# Patient Record
Sex: Male | Born: 1945 | Race: Black or African American | Hispanic: No | Marital: Married | Smoking: Former smoker
Health system: Southern US, Community
[De-identification: ages and names within clinical notes are randomized; demographics above are authoritative.]

## PROBLEM LIST (undated history)

## (undated) DIAGNOSIS — N289 Disorder of kidney and ureter, unspecified: Secondary | ICD-10-CM

## (undated) DIAGNOSIS — K219 Gastro-esophageal reflux disease without esophagitis: Secondary | ICD-10-CM

## (undated) DIAGNOSIS — I10 Essential (primary) hypertension: Secondary | ICD-10-CM

## (undated) HISTORY — PX: SUPRAPUBIC CATHETER PLACEMENT: SHX2473

---

## 1998-01-22 ENCOUNTER — Observation Stay (HOSPITAL_COMMUNITY): Admission: RE | Admit: 1998-01-22 | Discharge: 1998-01-23 | Payer: Self-pay | Admitting: Otolaryngology

## 2010-04-23 ENCOUNTER — Encounter (HOSPITAL_BASED_OUTPATIENT_CLINIC_OR_DEPARTMENT_OTHER)
Admission: RE | Admit: 2010-04-23 | Discharge: 2010-04-23 | Disposition: A | Discharge status: Home / Self Care | Payer: BC Managed Care – PPO | Source: Ambulatory Visit | Attending: Orthopedic Surgery | Admitting: Orthopedic Surgery

## 2010-04-23 DIAGNOSIS — Z01810 Encounter for preprocedural cardiovascular examination: Secondary | ICD-10-CM | POA: Insufficient documentation

## 2010-04-24 ENCOUNTER — Ambulatory Visit (HOSPITAL_BASED_OUTPATIENT_CLINIC_OR_DEPARTMENT_OTHER)
Admission: RE | Admit: 2010-04-24 | Discharge: 2010-04-24 | Disposition: A | Discharge status: Home / Self Care | Payer: BC Managed Care – PPO | Source: Ambulatory Visit | Attending: Orthopedic Surgery | Admitting: Orthopedic Surgery

## 2010-04-24 DIAGNOSIS — S93129A Dislocation of metatarsophalangeal joint of unspecified toe(s), initial encounter: Secondary | ICD-10-CM | POA: Insufficient documentation

## 2010-04-24 DIAGNOSIS — S92309A Fracture of unspecified metatarsal bone(s), unspecified foot, initial encounter for closed fracture: Secondary | ICD-10-CM | POA: Insufficient documentation

## 2010-04-24 DIAGNOSIS — W11XXXA Fall on and from ladder, initial encounter: Secondary | ICD-10-CM | POA: Insufficient documentation

## 2010-04-24 DIAGNOSIS — Y92009 Unspecified place in unspecified non-institutional (private) residence as the place of occurrence of the external cause: Secondary | ICD-10-CM | POA: Insufficient documentation

## 2010-04-24 DIAGNOSIS — Z01810 Encounter for preprocedural cardiovascular examination: Secondary | ICD-10-CM | POA: Insufficient documentation

## 2010-04-24 LAB — POCT HEMOGLOBIN-HEMACUE: Hemoglobin: 16.5 g/dL (ref 13.0–17.0)

## 2010-04-28 NOTE — Op Note (Signed)
Gerald Thompson, Gerald Thompson               ACCOUNT NO.:  [id number]  MEDICAL RECORD NO.:  [id number]           PATIENT TYPE:  LOCATION:                                 FACILITY:  PHYSICIAN:  Toni Arthurs, MD             DATE OF BIRTH:  DATE OF PROCEDURE:  04/24/2010 DATE OF DISCHARGE:                              OPERATIVE REPORT   PREOPERATIVE DIAGNOSES: 1. Right second and third metatarsal neck fractures. 2. Right fourth and fifth metatarsophalangeal joint dislocations.  POSTOPERATIVE DIAGNOSES: 1. Right second and third metatarsal neck fractures. 2. Right fourth and fifth metatarsophalangeal joint dislocations.  PROCEDURE: 1. Open reduction internal fixation of right second and third     metatarsal neck fractures. 2. Open reduction internal fixation of right fourth and fifth     metatarsophalangeal joint dislocations. 3. Right 2 through 5 metatarsophalangeal joint dorsal capsulotomies     with extensor digitorum brevis tenotomies of 2 through 4. 4. Right 2 through 5 extensor digitorum longus tenotomies (Z     lengthening). 5. Right fourth and fifth flexor to extensor transfers. 6. Intraoperative interpretation of fluoroscopic images greater than 1     hour.  SURGEON:  Toni Arthurs, MD  ANESTHESIA:  General, regional.  IV FLUIDS:  See anesthesia record.  ESTIMATED BLOOD LOSS:  Minimal.  TOURNIQUET TIME:  See anesthesia record.  COMPLICATIONS:  None apparent.  DISPOSITION:  Extubated awake, and stable to recovery.  INDICATIONS FOR PROCEDURE:  The patient is a 65 year old male who was in his usual state of health until he fell off a step ladder catching his right foot in the rungs as he fell.  This occurred more than a week ago. He presented to me with dorsal dislocations of his fourth and fifth toes at the MTP joints as well as displaced fractures of the second and third metatarsal necks.  He tells me that has had a longstanding problem with his fourth toe since an  injury playing sports in college.  He presents now for surgical treatment of these injuries.  He understands the risks and benefits of these procedures and elects to proceed.  PROCEDURE IN DETAIL:  After preoperative consent was obtained, the correct operative site was identified, the patient was brought to the operating room and placed supine on the operating table.  General anesthesia was induced.  Preoperative antibiotics were administered. Surgical time-out was taken.  Regional anesthetic had previously been administered.  The right lower extremity was prepped and draped in the standard sterile fashion.  The fourth and fifth MTP joint dislocations were addressed first.  The fifth MTP joint could be reduced passively that would not stay reduced, the fourth MTP could not be reduced in closed fashion.  Longitudinal incisions were marked over the second and fourth webspaces.  The extremity was exsanguinated and an Esmarch tourniquet was wrapped around the ankle.  The dorsal incision was made over the fourth webspace.  The blunt dissection was carried down through the subcutaneous tissue.  The extensor digitorum longus tendon was identified to the fifth toe.  It was noted to be  quite contracted.  It was lengthened in Z fashion.  A dorsal capsulotomy was then carried over the dorsal aspect of the fifth MTP joint.  The toe could then be reduced.  It was still quite prone to dislocation back into the dorsal position.  The decision was made to transfer the flexor tendon to hold this toe in its reduced position.  Attention was then turned to the fourth toe.  Blunt dissection was carried down to the level of the MTP joint.  The extensor digitorum brevis tendon was tenotomized and the segment resected.  This was done due to significant contracture of this tendon.  The extensor digitorum longus was delengthened again due to contracture.  A dorsal capsulotomy was performed.  The MTP joint was  then entered with a Therapist, nutritional and this was used to lever the toe down into a reduced position.  It would not stay reduced passively.  Again it was determined that a flexor to extensor transfer was necessary to preserve the reduction after removal of the pins postoperatively.  At this point, 0.054 K-wires were inserted through the tip of the toe and across the IP joints and into the MTP joint.  The toe was held in the reduced position and the wire was advanced into the metatarsal head and up into the metatarsal shaft. This was repeated for the fifth toe.  Both of the toes were now held in a reduced and straight position.  Prior to insertion of the K-wires, Brunner style incisions were made on the plantar aspect of the foot at the fourth and fifth toes.  Blunt dissection was carried down to the level of the flexor tendon.  The flexor tendon sheath was incised and the FDL tendon was identified.  It was isolated and released percutaneously from its insertion at the distal phalanx.  It was withdrawn through the plantar incision and split longitudinally.  It was tagged with 2-0 Vicryl sutures on either end of the tendon slips.  This was repeated for the fifth toe as well.  A small stab incision was made over the proximal phalanx dorsally.  A hemostat was then passed along the shaft to the proximal phalanx into the plantar incision retrieving the medial slip of tendon.  This was repeated for the lateral slip of tendon.  These were tied over the dorsum of the toe after placement of the K-wire.  This was repeated for the fifth toe.  At this point, AP and lateral x-ray showed appropriate reduction of the fourth and fifth toes at the MTP joint and appropriate position and length of both K-wires. The wounds were irrigated copiously.  Vertical mattress sutures and horizontal mattress sutures of 3-0 Prolene were used to close all of the skin incisions.  Attention was then turned to the second and  third toes.  The toe was entered from the tip with a 0.062 K-wire.  It was inserted retrograde across the DIP and PIP joints in the proximal phalanx.  An attempt was made at closed reduction with percutaneous pin, but this was unsuccessful.  A longitudinal incision marked over the second webspace was then made.  Blunt dissection was carried down through the subcutaneous tissue.  Once again, the extensor tendons were noted to be quite contracted.  Extensor digitorum brevis tenotomies were performed at both the toes.  The extensor digitorum longus tendons were both lengthened in Z fashion.  Dorsal capsulotomies were performed at both MTP joints.  This allowed passive correction of position of  the toes, which had previously been dorsally contracted.  The second metatarsal fracture was identified and a Glorious Peach was used to mobilize the fracture fragments.  A tenaculum was used to grasp the distal fragment and position it while a K-wire was advanced from the tip of the toe across the IP joints and the MP joint into the head.  It was then advanced across the fracture site and into the metatarsal shaft.  The third toe could not be reduced closed, so this procedure was again repeated. Final AP and lateral x-rays showed reduced fractures of the second and third metatarsals as well as dislocations of the fourth and fifth MTP joints that had been appropriately reduced.  The K-wires were all in appropriate position on AP and lateral views.  The dorsal wound was again irrigated.  Vertical mattress sutures of 3-0 Prolene were used to close the skin incisions.  All of the wires were trimmed and capped. Sterile dressings were applied followed by a well-padded short-leg splint.  Tourniquet was released after application of the dressings. All four lesser toes were noted to be well perfused postoperatively.  The patient was then awakened from anesthesia and transported to the recovery room in stable  condition.  FOLLOWUP PLAN:  The patient will be discharged to home today.  He will be nonweightbearing on the right lower extremity.  A followup with me in 2 weeks for suture removal and conversion to a cast.     Toni Arthurs, MD     JH/MEDQ  D:  04/24/2010  T:  04/24/2010  Job:  [id number]  Electronically Signed by Jonny Ruiz Koki Buxton  on 04/28/2010 09:09:00 AM

## 2014-05-15 DIAGNOSIS — A499 Bacterial infection, unspecified: Secondary | ICD-10-CM | POA: Diagnosis not present

## 2014-05-15 DIAGNOSIS — R3915 Urgency of urination: Secondary | ICD-10-CM | POA: Diagnosis not present

## 2014-05-15 DIAGNOSIS — F17210 Nicotine dependence, cigarettes, uncomplicated: Secondary | ICD-10-CM | POA: Diagnosis not present

## 2014-05-15 DIAGNOSIS — N319 Neuromuscular dysfunction of bladder, unspecified: Secondary | ICD-10-CM | POA: Diagnosis not present

## 2014-05-15 DIAGNOSIS — R8299 Other abnormal findings in urine: Secondary | ICD-10-CM | POA: Diagnosis not present

## 2014-05-15 DIAGNOSIS — N329 Bladder disorder, unspecified: Secondary | ICD-10-CM | POA: Diagnosis not present

## 2014-05-15 DIAGNOSIS — N1339 Other hydronephrosis: Secondary | ICD-10-CM | POA: Diagnosis not present

## 2014-06-10 DIAGNOSIS — A499 Bacterial infection, unspecified: Secondary | ICD-10-CM | POA: Diagnosis not present

## 2014-06-10 DIAGNOSIS — N390 Urinary tract infection, site not specified: Secondary | ICD-10-CM | POA: Diagnosis not present

## 2014-06-17 DIAGNOSIS — N401 Enlarged prostate with lower urinary tract symptoms: Secondary | ICD-10-CM | POA: Diagnosis not present

## 2014-06-17 DIAGNOSIS — R339 Retention of urine, unspecified: Secondary | ICD-10-CM | POA: Diagnosis not present

## 2014-08-26 DIAGNOSIS — N1330 Unspecified hydronephrosis: Secondary | ICD-10-CM | POA: Diagnosis not present

## 2014-08-26 DIAGNOSIS — Q6100 Congenital renal cyst, unspecified: Secondary | ICD-10-CM | POA: Diagnosis not present

## 2014-08-26 DIAGNOSIS — Z720 Tobacco use: Secondary | ICD-10-CM | POA: Diagnosis not present

## 2014-08-26 DIAGNOSIS — N319 Neuromuscular dysfunction of bladder, unspecified: Secondary | ICD-10-CM | POA: Diagnosis not present

## 2014-08-26 DIAGNOSIS — N1339 Other hydronephrosis: Secondary | ICD-10-CM | POA: Diagnosis not present

## 2014-09-05 DIAGNOSIS — F329 Major depressive disorder, single episode, unspecified: Secondary | ICD-10-CM | POA: Diagnosis not present

## 2014-09-05 DIAGNOSIS — R51 Headache: Secondary | ICD-10-CM | POA: Diagnosis not present

## 2014-09-05 DIAGNOSIS — K921 Melena: Secondary | ICD-10-CM | POA: Diagnosis not present

## 2014-09-05 DIAGNOSIS — Z86010 Personal history of colonic polyps: Secondary | ICD-10-CM | POA: Diagnosis not present

## 2014-09-05 DIAGNOSIS — R109 Unspecified abdominal pain: Secondary | ICD-10-CM | POA: Diagnosis not present

## 2014-09-05 DIAGNOSIS — R3915 Urgency of urination: Secondary | ICD-10-CM | POA: Diagnosis not present

## 2014-09-05 DIAGNOSIS — R319 Hematuria, unspecified: Secondary | ICD-10-CM | POA: Diagnosis not present

## 2014-09-05 DIAGNOSIS — F17210 Nicotine dependence, cigarettes, uncomplicated: Secondary | ICD-10-CM | POA: Diagnosis not present

## 2014-09-05 DIAGNOSIS — N401 Enlarged prostate with lower urinary tract symptoms: Secondary | ICD-10-CM | POA: Diagnosis not present

## 2014-09-05 DIAGNOSIS — R5383 Other fatigue: Secondary | ICD-10-CM | POA: Diagnosis not present

## 2014-09-05 DIAGNOSIS — R110 Nausea: Secondary | ICD-10-CM | POA: Diagnosis not present

## 2014-09-05 DIAGNOSIS — H9190 Unspecified hearing loss, unspecified ear: Secondary | ICD-10-CM | POA: Diagnosis not present

## 2014-09-05 DIAGNOSIS — K219 Gastro-esophageal reflux disease without esophagitis: Secondary | ICD-10-CM | POA: Diagnosis not present

## 2014-09-05 DIAGNOSIS — R634 Abnormal weight loss: Secondary | ICD-10-CM | POA: Diagnosis not present

## 2014-09-05 DIAGNOSIS — R5381 Other malaise: Secondary | ICD-10-CM | POA: Diagnosis not present

## 2014-09-05 DIAGNOSIS — H538 Other visual disturbances: Secondary | ICD-10-CM | POA: Diagnosis not present

## 2014-09-05 DIAGNOSIS — R6883 Chills (without fever): Secondary | ICD-10-CM | POA: Diagnosis not present

## 2014-09-05 DIAGNOSIS — Z801 Family history of malignant neoplasm of trachea, bronchus and lung: Secondary | ICD-10-CM | POA: Diagnosis not present

## 2014-09-05 DIAGNOSIS — R300 Dysuria: Secondary | ICD-10-CM | POA: Diagnosis not present

## 2014-09-05 DIAGNOSIS — R6881 Early satiety: Secondary | ICD-10-CM | POA: Diagnosis not present

## 2014-09-05 DIAGNOSIS — Z800 Family history of malignant neoplasm of digestive organs: Secondary | ICD-10-CM | POA: Diagnosis not present

## 2014-09-05 DIAGNOSIS — K862 Cyst of pancreas: Secondary | ICD-10-CM | POA: Diagnosis not present

## 2014-09-05 DIAGNOSIS — K5900 Constipation, unspecified: Secondary | ICD-10-CM | POA: Diagnosis not present

## 2014-09-05 DIAGNOSIS — R05 Cough: Secondary | ICD-10-CM | POA: Diagnosis not present

## 2014-09-05 DIAGNOSIS — Z79899 Other long term (current) drug therapy: Secondary | ICD-10-CM | POA: Diagnosis not present

## 2014-09-16 DIAGNOSIS — R339 Retention of urine, unspecified: Secondary | ICD-10-CM | POA: Diagnosis not present

## 2014-09-16 DIAGNOSIS — N401 Enlarged prostate with lower urinary tract symptoms: Secondary | ICD-10-CM | POA: Diagnosis not present

## 2014-09-17 DIAGNOSIS — D123 Benign neoplasm of transverse colon: Secondary | ICD-10-CM | POA: Diagnosis not present

## 2014-09-17 DIAGNOSIS — D125 Benign neoplasm of sigmoid colon: Secondary | ICD-10-CM | POA: Diagnosis not present

## 2014-09-17 DIAGNOSIS — K219 Gastro-esophageal reflux disease without esophagitis: Secondary | ICD-10-CM | POA: Diagnosis not present

## 2014-09-17 DIAGNOSIS — I10 Essential (primary) hypertension: Secondary | ICD-10-CM | POA: Diagnosis not present

## 2014-09-17 DIAGNOSIS — Z800 Family history of malignant neoplasm of digestive organs: Secondary | ICD-10-CM | POA: Diagnosis not present

## 2014-09-17 DIAGNOSIS — R6881 Early satiety: Secondary | ICD-10-CM | POA: Diagnosis not present

## 2014-09-17 DIAGNOSIS — Z1211 Encounter for screening for malignant neoplasm of colon: Secondary | ICD-10-CM | POA: Diagnosis not present

## 2014-09-17 DIAGNOSIS — R630 Anorexia: Secondary | ICD-10-CM | POA: Diagnosis not present

## 2014-09-17 DIAGNOSIS — Z86010 Personal history of colonic polyps: Secondary | ICD-10-CM | POA: Diagnosis not present

## 2014-09-17 DIAGNOSIS — R634 Abnormal weight loss: Secondary | ICD-10-CM | POA: Diagnosis not present

## 2014-09-23 DIAGNOSIS — R339 Retention of urine, unspecified: Secondary | ICD-10-CM | POA: Diagnosis not present

## 2014-09-23 DIAGNOSIS — N319 Neuromuscular dysfunction of bladder, unspecified: Secondary | ICD-10-CM | POA: Diagnosis not present

## 2014-09-23 DIAGNOSIS — N399 Disorder of urinary system, unspecified: Secondary | ICD-10-CM | POA: Diagnosis not present

## 2014-09-23 DIAGNOSIS — R319 Hematuria, unspecified: Secondary | ICD-10-CM | POA: Diagnosis not present

## 2014-09-23 DIAGNOSIS — N390 Urinary tract infection, site not specified: Secondary | ICD-10-CM | POA: Diagnosis not present

## 2014-10-03 DIAGNOSIS — F329 Major depressive disorder, single episode, unspecified: Secondary | ICD-10-CM | POA: Diagnosis not present

## 2014-10-03 DIAGNOSIS — R634 Abnormal weight loss: Secondary | ICD-10-CM | POA: Diagnosis not present

## 2014-10-03 DIAGNOSIS — D649 Anemia, unspecified: Secondary | ICD-10-CM | POA: Diagnosis not present

## 2014-10-03 DIAGNOSIS — Z720 Tobacco use: Secondary | ICD-10-CM | POA: Diagnosis not present

## 2014-10-03 DIAGNOSIS — K4020 Bilateral inguinal hernia, without obstruction or gangrene, not specified as recurrent: Secondary | ICD-10-CM | POA: Diagnosis not present

## 2014-10-03 DIAGNOSIS — Z86010 Personal history of colonic polyps: Secondary | ICD-10-CM | POA: Diagnosis not present

## 2014-10-03 DIAGNOSIS — N319 Neuromuscular dysfunction of bladder, unspecified: Secondary | ICD-10-CM | POA: Diagnosis not present

## 2014-10-03 DIAGNOSIS — I129 Hypertensive chronic kidney disease with stage 1 through stage 4 chronic kidney disease, or unspecified chronic kidney disease: Secondary | ICD-10-CM | POA: Diagnosis not present

## 2014-10-03 DIAGNOSIS — Z862 Personal history of diseases of the blood and blood-forming organs and certain disorders involving the immune mechanism: Secondary | ICD-10-CM | POA: Diagnosis not present

## 2014-10-03 DIAGNOSIS — N184 Chronic kidney disease, stage 4 (severe): Secondary | ICD-10-CM | POA: Diagnosis not present

## 2014-11-07 DIAGNOSIS — D649 Anemia, unspecified: Secondary | ICD-10-CM | POA: Diagnosis not present

## 2014-11-07 DIAGNOSIS — I129 Hypertensive chronic kidney disease with stage 1 through stage 4 chronic kidney disease, or unspecified chronic kidney disease: Secondary | ICD-10-CM | POA: Diagnosis not present

## 2014-11-07 DIAGNOSIS — Z79899 Other long term (current) drug therapy: Secondary | ICD-10-CM | POA: Diagnosis not present

## 2014-11-07 DIAGNOSIS — N184 Chronic kidney disease, stage 4 (severe): Secondary | ICD-10-CM | POA: Diagnosis not present

## 2014-11-07 DIAGNOSIS — F17210 Nicotine dependence, cigarettes, uncomplicated: Secondary | ICD-10-CM | POA: Diagnosis not present

## 2014-11-07 DIAGNOSIS — N319 Neuromuscular dysfunction of bladder, unspecified: Secondary | ICD-10-CM | POA: Diagnosis not present

## 2014-11-07 DIAGNOSIS — F329 Major depressive disorder, single episode, unspecified: Secondary | ICD-10-CM | POA: Diagnosis not present

## 2014-11-07 DIAGNOSIS — N189 Chronic kidney disease, unspecified: Secondary | ICD-10-CM | POA: Diagnosis not present

## 2014-11-07 DIAGNOSIS — R339 Retention of urine, unspecified: Secondary | ICD-10-CM | POA: Diagnosis not present

## 2014-11-21 DIAGNOSIS — N401 Enlarged prostate with lower urinary tract symptoms: Secondary | ICD-10-CM | POA: Diagnosis not present

## 2014-11-21 DIAGNOSIS — R339 Retention of urine, unspecified: Secondary | ICD-10-CM | POA: Diagnosis not present

## 2014-12-25 DIAGNOSIS — N319 Neuromuscular dysfunction of bladder, unspecified: Secondary | ICD-10-CM | POA: Diagnosis not present

## 2015-01-30 DIAGNOSIS — N401 Enlarged prostate with lower urinary tract symptoms: Secondary | ICD-10-CM | POA: Diagnosis not present

## 2015-01-30 DIAGNOSIS — R339 Retention of urine, unspecified: Secondary | ICD-10-CM | POA: Diagnosis not present

## 2015-03-02 DIAGNOSIS — N289 Disorder of kidney and ureter, unspecified: Secondary | ICD-10-CM

## 2015-03-02 HISTORY — DX: Disorder of kidney and ureter, unspecified: N28.9

## 2015-03-31 DIAGNOSIS — R339 Retention of urine, unspecified: Secondary | ICD-10-CM | POA: Diagnosis not present

## 2015-03-31 DIAGNOSIS — N183 Chronic kidney disease, stage 3 (moderate): Secondary | ICD-10-CM | POA: Diagnosis not present

## 2015-04-07 DIAGNOSIS — N184 Chronic kidney disease, stage 4 (severe): Secondary | ICD-10-CM | POA: Diagnosis not present

## 2015-04-07 DIAGNOSIS — I129 Hypertensive chronic kidney disease with stage 1 through stage 4 chronic kidney disease, or unspecified chronic kidney disease: Secondary | ICD-10-CM | POA: Diagnosis not present

## 2015-04-07 DIAGNOSIS — Z87891 Personal history of nicotine dependence: Secondary | ICD-10-CM | POA: Diagnosis not present

## 2015-04-07 DIAGNOSIS — N390 Urinary tract infection, site not specified: Secondary | ICD-10-CM | POA: Diagnosis not present

## 2015-04-07 DIAGNOSIS — R319 Hematuria, unspecified: Secondary | ICD-10-CM | POA: Diagnosis not present

## 2015-04-10 DIAGNOSIS — R748 Abnormal levels of other serum enzymes: Secondary | ICD-10-CM | POA: Diagnosis not present

## 2015-04-10 DIAGNOSIS — N390 Urinary tract infection, site not specified: Secondary | ICD-10-CM | POA: Diagnosis not present

## 2015-04-10 DIAGNOSIS — R319 Hematuria, unspecified: Secondary | ICD-10-CM | POA: Diagnosis not present

## 2015-04-10 DIAGNOSIS — Z87891 Personal history of nicotine dependence: Secondary | ICD-10-CM | POA: Diagnosis not present

## 2015-04-10 DIAGNOSIS — N184 Chronic kidney disease, stage 4 (severe): Secondary | ICD-10-CM | POA: Diagnosis not present

## 2015-04-10 DIAGNOSIS — R7989 Other specified abnormal findings of blood chemistry: Secondary | ICD-10-CM | POA: Diagnosis not present

## 2015-04-10 DIAGNOSIS — I129 Hypertensive chronic kidney disease with stage 1 through stage 4 chronic kidney disease, or unspecified chronic kidney disease: Secondary | ICD-10-CM | POA: Diagnosis not present

## 2015-04-18 DIAGNOSIS — N390 Urinary tract infection, site not specified: Secondary | ICD-10-CM | POA: Diagnosis not present

## 2015-04-18 DIAGNOSIS — R319 Hematuria, unspecified: Secondary | ICD-10-CM | POA: Diagnosis not present

## 2015-04-21 DIAGNOSIS — N1330 Unspecified hydronephrosis: Secondary | ICD-10-CM | POA: Diagnosis not present

## 2015-04-21 DIAGNOSIS — R7989 Other specified abnormal findings of blood chemistry: Secondary | ICD-10-CM | POA: Diagnosis not present

## 2015-05-08 DIAGNOSIS — N319 Neuromuscular dysfunction of bladder, unspecified: Secondary | ICD-10-CM | POA: Diagnosis not present

## 2015-05-19 DIAGNOSIS — Z79899 Other long term (current) drug therapy: Secondary | ICD-10-CM | POA: Diagnosis not present

## 2015-05-19 DIAGNOSIS — Z960 Presence of urogenital implants: Secondary | ICD-10-CM | POA: Diagnosis not present

## 2015-05-19 DIAGNOSIS — N184 Chronic kidney disease, stage 4 (severe): Secondary | ICD-10-CM | POA: Diagnosis not present

## 2015-05-19 DIAGNOSIS — Z87891 Personal history of nicotine dependence: Secondary | ICD-10-CM | POA: Diagnosis not present

## 2015-05-19 DIAGNOSIS — I129 Hypertensive chronic kidney disease with stage 1 through stage 4 chronic kidney disease, or unspecified chronic kidney disease: Secondary | ICD-10-CM | POA: Diagnosis not present

## 2015-06-03 DIAGNOSIS — N401 Enlarged prostate with lower urinary tract symptoms: Secondary | ICD-10-CM | POA: Diagnosis not present

## 2015-06-03 DIAGNOSIS — R339 Retention of urine, unspecified: Secondary | ICD-10-CM | POA: Diagnosis not present

## 2015-06-10 DIAGNOSIS — N184 Chronic kidney disease, stage 4 (severe): Secondary | ICD-10-CM | POA: Diagnosis not present

## 2015-06-10 DIAGNOSIS — R339 Retention of urine, unspecified: Secondary | ICD-10-CM | POA: Diagnosis not present

## 2015-06-10 DIAGNOSIS — K4021 Bilateral inguinal hernia, without obstruction or gangrene, recurrent: Secondary | ICD-10-CM | POA: Diagnosis not present

## 2015-06-10 DIAGNOSIS — N319 Neuromuscular dysfunction of bladder, unspecified: Secondary | ICD-10-CM | POA: Diagnosis not present

## 2015-07-01 DIAGNOSIS — R339 Retention of urine, unspecified: Secondary | ICD-10-CM | POA: Diagnosis not present

## 2015-07-01 DIAGNOSIS — R8290 Unspecified abnormal findings in urine: Secondary | ICD-10-CM | POA: Diagnosis not present

## 2015-07-03 DIAGNOSIS — H6121 Impacted cerumen, right ear: Secondary | ICD-10-CM | POA: Diagnosis not present

## 2015-07-22 DIAGNOSIS — R339 Retention of urine, unspecified: Secondary | ICD-10-CM | POA: Diagnosis not present

## 2015-08-04 ENCOUNTER — Emergency Department (HOSPITAL_COMMUNITY)
Admission: EM | Admit: 2015-08-04 | Discharge: 2015-08-04 | Disposition: A | Discharge status: Home / Self Care | Payer: BLUE CROSS/BLUE SHIELD | Attending: Emergency Medicine | Admitting: Emergency Medicine

## 2015-08-04 ENCOUNTER — Encounter (HOSPITAL_COMMUNITY): Payer: Self-pay

## 2015-08-04 DIAGNOSIS — T83091A Other mechanical complication of indwelling urethral catheter, initial encounter: Secondary | ICD-10-CM | POA: Diagnosis not present

## 2015-08-04 DIAGNOSIS — Y733 Surgical instruments, materials and gastroenterology and urology devices (including sutures) associated with adverse incidents: Secondary | ICD-10-CM | POA: Insufficient documentation

## 2015-08-04 DIAGNOSIS — T83511A Infection and inflammatory reaction due to indwelling urethral catheter, initial encounter: Secondary | ICD-10-CM | POA: Diagnosis not present

## 2015-08-04 DIAGNOSIS — N390 Urinary tract infection, site not specified: Secondary | ICD-10-CM | POA: Diagnosis not present

## 2015-08-04 DIAGNOSIS — T83098A Other mechanical complication of other indwelling urethral catheter, initial encounter: Secondary | ICD-10-CM | POA: Diagnosis not present

## 2015-08-04 DIAGNOSIS — Z792 Long term (current) use of antibiotics: Secondary | ICD-10-CM | POA: Insufficient documentation

## 2015-08-04 DIAGNOSIS — T839XXA Unspecified complication of genitourinary prosthetic device, implant and graft, initial encounter: Secondary | ICD-10-CM

## 2015-08-04 DIAGNOSIS — Z79899 Other long term (current) drug therapy: Secondary | ICD-10-CM | POA: Insufficient documentation

## 2015-08-04 DIAGNOSIS — B9629 Other Escherichia coli [E. coli] as the cause of diseases classified elsewhere: Secondary | ICD-10-CM | POA: Diagnosis not present

## 2015-08-04 DIAGNOSIS — R339 Retention of urine, unspecified: Secondary | ICD-10-CM | POA: Diagnosis present

## 2015-08-04 LAB — URINALYSIS, ROUTINE W REFLEX MICROSCOPIC
Bilirubin Urine: NEGATIVE
Glucose, UA: NEGATIVE mg/dL
Ketones, ur: NEGATIVE mg/dL
Nitrite: POSITIVE — AB
PROTEIN: 100 mg/dL — AB
SPECIFIC GRAVITY, URINE: 1.011 (ref 1.005–1.030)
pH: 6.5 (ref 5.0–8.0)

## 2015-08-04 LAB — URINE MICROSCOPIC-ADD ON

## 2015-08-04 MED ORDER — CEFTRIAXONE SODIUM 1 G IJ SOLR
1.0000 g | Freq: Once | INTRAMUSCULAR | Status: AC
  Administered 2015-08-04: 1 g via INTRAMUSCULAR
  Filled 2015-08-04: qty 10

## 2015-08-04 MED ORDER — STERILE WATER FOR INJECTION IJ SOLN
INTRAMUSCULAR | Status: AC
  Administered 2015-08-04: 10 mL
  Filled 2015-08-04: qty 10

## 2015-08-04 MED ORDER — CEPHALEXIN 500 MG PO CAPS: 500.0000 mg | ORAL_CAPSULE | Freq: Four times a day (QID) | ORAL | Status: DC

## 2015-08-04 NOTE — Discharge Instructions (Signed)
If you develop fever or any new or worsening symptoms you should be reevaluated immediately.   Foley Catheter Care, Adult A Foley catheter is a soft, flexible tube that is placed into the bladder to drain urine. A Foley catheter may be inserted if:  You leak urine or are not able to control when you urinate (urinary incontinence).  You are not able to urinate when you need to (urinary retention).  You had prostate surgery or surgery on the genitals.  You have certain medical conditions, such as multiple sclerosis, dementia, or a spinal cord injury. If you are going home with a Foley catheter in place, follow the instructions below. TAKING CARE OF THE CATHETER 1. Wash your hands with soap and water. 2. Using mild soap and warm water on a clean washcloth:  Clean the area on your body closest to the catheter insertion site using a circular motion, moving away from the catheter. Never wipe toward the catheter because this could sweep bacteria up into the urethra and cause infection.  Remove all traces of soap. Pat the area dry with a clean towel. For males, reposition the foreskin. 3. Attach the catheter to your leg so there is no tension on the catheter. Use adhesive tape or a leg strap. If you are using adhesive tape, remove any sticky residue left behind by the previous tape you used. 4. Keep the drainage bag below the level of the bladder, but keep it off the floor. 5. Check throughout the day to be sure the catheter is working and urine is draining freely. Make sure the tubing does not become kinked. 6. Do not pull on the catheter or try to remove it. Pulling could damage internal tissues. TAKING CARE OF THE DRAINAGE BAGS You will be given two drainage bags to take home. One is a large overnight drainage bag, and the other is a smaller leg bag that fits underneath clothing. You may wear the overnight bag at any time, but you should never wear the smaller leg bag at night. Follow the  instructions below for how to empty, change, and clean your drainage bags. Emptying the Drainage Bag You must empty your drainage bag when it is  - full or at least 2-3 times a day. 1. Wash your hands with soap and water. 2. Keep the drainage bag below your hips, below the level of your bladder. This stops urine from going back into the tubing and into your bladder. 3. Hold the dirty bag over the toilet or a clean container. 4. Open the pour spout at the bottom of the bag and empty the urine into the toilet or container. Do not let the pour spout touch the toilet, container, or any other surface. Doing so can place bacteria on the bag, which can cause an infection. 5. Clean the pour spout with a gauze pad or cotton ball that has rubbing alcohol on it. 6. Close the pour spout. 7. Attach the bag to your leg with adhesive tape or a leg strap. 8. Wash your hands well. Changing the Drainage Bag Change your drainage bag once a month or sooner if it starts to smell bad or look dirty. Below are steps to follow when changing the drainage bag. 1. Wash your hands with soap and water. 2. Pinch off the rubber catheter so that urine does not spill out. 3. Disconnect the catheter tube from the drainage tube at the connection valve. Do not let the tubes touch any surface. 4. Clean  the end of the catheter tube with an alcohol wipe. Use a different alcohol wipe to clean the end of the drainage tube. 5. Connect the catheter tube to the drainage tube of the clean drainage bag. 6. Attach the new bag to the leg with adhesive tape or a leg strap. Avoid attaching the new bag too tightly. 7. Wash your hands well. Cleaning the Drainage Bag 1. Wash your hands with soap and water. 2. Wash the bag in warm, soapy water. 3. Rinse the bag thoroughly with warm water. 4. Fill the bag with a solution of white vinegar and water (1 cup vinegar to 1 qt warm water [.2 L vinegar to 1 L warm water]). Close the bag and soak it for  30 minutes in the solution. 5. Rinse the bag with warm water. 6. Hang the bag to dry with the pour spout open and hanging downward. 7. Store the clean bag (once it is dry) in a clean plastic bag. 8. Wash your hands well. PREVENTING INFECTION  Wash your hands before and after handling your catheter.  Take showers daily and wash the area where the catheter enters your body. Do not take baths. Replace wet leg straps with dry ones, if this applies.  Do not use powders, sprays, or lotions on the genital area. Only use creams, lotions, or ointments as directed by your caregiver.  For females, wipe from front to back after each bowel movement.  Drink enough fluids to keep your urine clear or pale yellow unless you have a fluid restriction.  Do not let the drainage bag or tubing touch or lie on the floor.  Wear cotton underwear to absorb moisture and to keep your skin drier. SEEK MEDICAL CARE IF:   Your urine is cloudy or smells unusually bad.  Your catheter becomes clogged.  You are not draining urine into the bag or your bladder feels full.  Your catheter starts to leak. SEEK IMMEDIATE MEDICAL CARE IF:   You have pain, swelling, redness, or pus where the catheter enters the body.  You have pain in the abdomen, legs, lower back, or bladder.  You have a fever.  You see blood fill the catheter, or your urine is pink or red.  You have nausea, vomiting, or chills.  Your catheter gets pulled out. MAKE SURE YOU:   Understand these instructions.  Will watch your condition.  Will get help right away if you are not doing well or get worse.   This information is not intended to replace advice given to you by your health care provider. Make sure you discuss any questions you have with your health care provider.   Document Released: 02/15/2005 Document Revised: 07/02/2013 Document Reviewed: 02/07/2012 Elsevier Interactive Patient Education 2016 [place] Urinary Tract Infection FAQs What is "catheter-associated urinary tract infection"? A urinary tract infection (also called "UTI") is an infection in the urinary system, which includes the bladder (which stores the urine) and the kidneys (which filter the blood to make urine). Germs (for example, bacteria or yeasts) do not normally live in these areas; but if germs are introduced, an infection can occur. If you have a urinary catheter, germs can travel along the catheter and cause an infection in your bladder or your kidney; in that case it is called a catheter-associated urinary tract infection (or "CA-UTI").  What is a urinary catheter? A urinary catheter is a thin tube placed in the bladder to drain urine. Urine drains through  the tube into a bag that collects the urine. A urinary catheter may be used:  If you are not able to urinate on your own  To measure the amount of urine that you make, for example, during intensive care  During and after some types of surgery  During some tests of the kidneys and bladder People with urinary catheters have a much higher chance of getting a urinary tract infection than people who don't have a catheter. How do I get a catheter-associated urinary tract infection (CA-UTI)? If germs enter the urinary tract, they may cause an infection. Many of the germs that cause a catheter-associated urinary tract infection are common germs found in your intestines that do not usually cause an infection there. Germs can enter the urinary tract when the catheter is being put in or while the catheter remains in the bladder.  What are the symptoms of a urinary tract infection? Some of the common symptoms of a urinary tract infection are: 7. Burning or pain in the lower abdomen (that is, below the stomach) 8. Fever 9. Bloody urine may be a sign of infection, but is also caused by other problems 10. Burning during urination or an increase in the  frequency of urination after the catheter is removed. Sometimes people with catheter-associated urinary tract infections do not have these symptoms of infection. Can catheter-associated urinary tract infections be treated? Yes, most catheter-associated urinary tract infections can be treated with antibiotics and removal or change of the catheter. Your doctor will determine which antibiotic is best for you.  What are some of the things that hospitals are doing to prevent catheter-associated urinary tract infections? To prevent urinary tract infections, doctors and nurses take the following actions.  Catheter insertion 9. External catheters in men (these look like condoms and are placed over the penis rather than into the penis) 10. Putting a temporary catheter in to drain the urine and removing it right away. This is called intermittent urethral catheterization. Catheter care What can I do to help prevent catheter-associated urinary tract infections if I have a catheter? 9. Always clean your hands before and after doing catheter care. 10. Always keep your urine bag below the level of your bladder. 11. Do not tug or pull on the tubing. 12. Do not twist or kink the catheter tubing. 70. Ask your healthcare provider each day if you still need the catheter. What do I need to do when I go home from the hospital?  If you will be going home with a catheter, your doctor or nurse should explain everything you need to know about taking care of the catheter. Make sure you understand how to care for it before you leave the hospital.  If you develop any of the symptoms of a urinary tract infection, such as burning or pain in the lower abdomen, fever, or an increase in the frequency of urination, contact your doctor or nurse immediately.  Before you go home, make sure you know who to contact if you have questions or problems after you get home. If you have questions, please ask your doctor or  nurse. Developed and co-sponsored by [employer] for [place] [phone]); Infectious Diseases Society of [place] (IDSA); [place]; Association for Professionals in Infection Control and Epidemiology (APIC); Centers for Disease Control and Prevention (CDC); and [employer].   This information is not intended to replace advice given to you by your health care provider. Make sure you discuss any  questions you have with your health care provider.   Document Released: 11/10/2011 Document Revised: 07/02/2014 Document Reviewed: 05/01/2014 Elsevier Interactive Patient Education [employer].

## 2015-08-04 NOTE — ED Provider Notes (Signed)
CSN: [id number]     Arrival date & time 08/04/15  [id number] History   First MD Initiated Contact with Patient 08/04/15 0425     Chief Complaint  Patient presents with  . Urinary Retention     (Consider location/radiation/quality/duration/timing/severity/associated sxs/prior Treatment) HPI  This is a 70 year old male with a chronic indwelling Foley catheter who presents with urinary retention and lower abdominal pain. Patient reports lower abdominal discomfort and decreased drainage into his urinary bag as of 7 PM last night. He denies any fevers or back pain. Does report that he had similar symptoms approximately one month ago and was seen at [facility] where he is followed and had a urinary tract infection. Per nursing and triage, Foley was clogged and changed. Urine was foul-smelling. Patient otherwise reports improvement of his lower abdominal pain with Foley replacement. He is currently asymptomatic.  No past medical history on file. History reviewed. No pertinent past surgical history. No family history on file. Social History  Substance Use Topics  . Smoking status: None  . Smokeless tobacco: None  . Alcohol Use: None    Review of Systems  Constitutional: Negative for fever.  Gastrointestinal: Positive for abdominal pain.  Genitourinary: Negative for flank pain.       Catheter blockage  Psychiatric/Behavioral: Negative for confusion.  All other systems reviewed and are negative.     Allergies  Review of patient's allergies indicates no known allergies.  Home Medications   Prior to Admission medications   Medication Sig Start Date End Date Taking? Authorizing Provider  amLODipine (NORVASC) 2.5 MG tablet Take 2.5 mg by mouth.   Yes Historical Provider, MD  buPROPion (WELLBUTRIN) 75 MG tablet Take 150 mg by mouth.   Yes Historical Provider, MD  ciprofloxacin-dexamethasone (CIPRODEX) otic suspension Place 2 drops into both ears at bedtime.   Yes  Historical Provider, MD  ferrous sulfate 325 (65 FE) MG tablet Take 325 mg by mouth daily with breakfast.   Yes Historical Provider, MD  oxybutynin (DITROPAN XL) 10 MG 24 hr tablet Take 10 mg by mouth. 04/21/15  Yes Historical Provider, MD  ranitidine (ZANTAC) 150 MG capsule Take 150 mg by mouth 2 (two) times daily.   Yes Historical Provider, MD  cephALEXin (KEFLEX) 500 MG capsule Take 1 capsule (500 mg total) by mouth 4 (four) times daily. 08/04/15   Merryl Hacker, MD   BP 155/104 mmHg  Pulse 85  Temp(Src) 98.3 F (36.8 C) (Oral)  Resp 24  SpO2 100% Physical Exam  Constitutional: He is oriented to person, place, and time. He appears well-developed and well-nourished.  HENT:  Head: Normocephalic and atraumatic.  Bilateral hearing aids in place  Neck: Neck supple.  Cardiovascular: Normal rate, regular rhythm and normal heart sounds.   No murmur heard. Pulmonary/Chest: Effort normal and breath sounds normal. No respiratory distress. He has no wheezes.  Abdominal: Soft. Bowel sounds are normal. There is no tenderness. There is no rebound.  Genitourinary:  Foley catheter in place draining pink murky urine  Musculoskeletal: He exhibits no edema.  Neurological: He is alert and oriented to person, place, and time.  Skin: Skin is warm and dry.  Psychiatric: He has a normal mood and affect.  Nursing note and vitals reviewed.   ED Course  Procedures (including critical care time) Labs Review Labs Reviewed  URINALYSIS, ROUTINE W REFLEX MICROSCOPIC (NOT AT [facility]) - Abnormal; Notable for the following:    APPearance TURBID (*)    Hgb  urine dipstick LARGE (*)    Protein, ur 100 (*)    Nitrite POSITIVE (*)    Leukocytes, UA LARGE (*)    All other components within normal limits  URINE MICROSCOPIC-ADD ON - Abnormal; Notable for the following:    Squamous Epithelial / LPF 0-5 (*)    Bacteria, UA MANY (*)    All other components within normal limits  URINE CULTURE    Imaging Review No  results found. I have personally reviewed and evaluated these images and lab results as part of my medical decision-making.   EKG Interpretation None      MDM   Final diagnoses:  Urinary tract infection associated with catheterization of urinary tract, initial encounter  Complication of Foley catheter, initial encounter [facility])    Patient presents with Foley catheter complication. The catheter was not draining appropriately. This was replaced and patient had improvement of his symptoms. It was noted that his urine appeared murky.  Urinalysis with nitrite positive urine and large leuks. Prior urine culture at [facility] grew out Escherichia coli susceptible to cephalosporins. Patient was given IM Rocephin and will be discharged with a course of Keflex. Currently he has no signs or symptoms of systemic illness.  After history, exam, and medical workup I feel the patient has been appropriately medically screened and is safe for discharge home. Pertinent diagnoses were discussed with the patient. Patient was given return precautions.     Merryl Hacker, MD 08/04/15 0530

## 2015-08-04 NOTE — ED Notes (Signed)
Pt has had a clogged foley since midnight tonight. Pain and tenderness to area.

## 2015-08-06 LAB — URINE CULTURE: Culture: >=100000 — AB

## 2015-08-07 ENCOUNTER — Telehealth: Payer: Self-pay | Admitting: *Deleted

## 2015-08-07 NOTE — ED Notes (Signed)
Post ED Visit - Positive Culture Follow-up  Culture report reviewed by antimicrobial stewardship pharmacist:  []  Elenor Quinones, Pharm.D. []  Heide Guile, Pharm.D., BCPS []  Parks Neptune, Pharm.D. []  Alycia Rossetti, Pharm.D., BCPS []  [place], Pharm.D., BCPS, AAHIVP []  Legrand Como, Pharm.D., BCPS, AAHIVP [x]  Milus Glazier, Pharm.D. []  Rob Evette Doffing, Pharm.D.  Positive urine culture Treated with Cephalexin, organism sensitive to the same and no further patient follow-up is required at this time.  Harlon Flor [facility] 08/07/2015, 11:56 AM

## 2015-08-12 DIAGNOSIS — R339 Retention of urine, unspecified: Secondary | ICD-10-CM | POA: Diagnosis not present

## 2015-09-01 DIAGNOSIS — R339 Retention of urine, unspecified: Secondary | ICD-10-CM | POA: Diagnosis not present

## 2015-09-16 DIAGNOSIS — N319 Neuromuscular dysfunction of bladder, unspecified: Secondary | ICD-10-CM | POA: Diagnosis not present

## 2015-09-16 DIAGNOSIS — N184 Chronic kidney disease, stage 4 (severe): Secondary | ICD-10-CM | POA: Diagnosis not present

## 2015-09-16 DIAGNOSIS — R339 Retention of urine, unspecified: Secondary | ICD-10-CM | POA: Diagnosis not present

## 2015-09-29 DIAGNOSIS — R339 Retention of urine, unspecified: Secondary | ICD-10-CM | POA: Diagnosis not present

## 2015-10-06 DIAGNOSIS — I129 Hypertensive chronic kidney disease with stage 1 through stage 4 chronic kidney disease, or unspecified chronic kidney disease: Secondary | ICD-10-CM | POA: Diagnosis not present

## 2015-10-06 DIAGNOSIS — H918X3 Other specified hearing loss, bilateral: Secondary | ICD-10-CM | POA: Diagnosis not present

## 2015-10-06 DIAGNOSIS — N3289 Other specified disorders of bladder: Secondary | ICD-10-CM | POA: Diagnosis not present

## 2015-10-06 DIAGNOSIS — K862 Cyst of pancreas: Secondary | ICD-10-CM | POA: Diagnosis not present

## 2015-10-06 DIAGNOSIS — Z79899 Other long term (current) drug therapy: Secondary | ICD-10-CM | POA: Diagnosis not present

## 2015-10-06 DIAGNOSIS — K219 Gastro-esophageal reflux disease without esophagitis: Secondary | ICD-10-CM | POA: Diagnosis not present

## 2015-10-06 DIAGNOSIS — N184 Chronic kidney disease, stage 4 (severe): Secondary | ICD-10-CM | POA: Diagnosis not present

## 2015-10-06 DIAGNOSIS — K4020 Bilateral inguinal hernia, without obstruction or gangrene, not specified as recurrent: Secondary | ICD-10-CM | POA: Diagnosis not present

## 2015-10-06 DIAGNOSIS — N1330 Unspecified hydronephrosis: Secondary | ICD-10-CM | POA: Diagnosis not present

## 2015-10-06 DIAGNOSIS — D649 Anemia, unspecified: Secondary | ICD-10-CM | POA: Diagnosis not present

## 2015-10-06 DIAGNOSIS — R339 Retention of urine, unspecified: Secondary | ICD-10-CM | POA: Diagnosis not present

## 2015-10-06 DIAGNOSIS — Z86010 Personal history of colonic polyps: Secondary | ICD-10-CM | POA: Diagnosis not present

## 2015-10-06 DIAGNOSIS — N323 Diverticulum of bladder: Secondary | ICD-10-CM | POA: Diagnosis not present

## 2015-10-13 DIAGNOSIS — N184 Chronic kidney disease, stage 4 (severe): Secondary | ICD-10-CM | POA: Diagnosis not present

## 2015-10-13 DIAGNOSIS — D649 Anemia, unspecified: Secondary | ICD-10-CM | POA: Diagnosis not present

## 2015-10-13 DIAGNOSIS — N3289 Other specified disorders of bladder: Secondary | ICD-10-CM | POA: Diagnosis not present

## 2015-10-13 DIAGNOSIS — Z79899 Other long term (current) drug therapy: Secondary | ICD-10-CM | POA: Diagnosis not present

## 2015-10-13 DIAGNOSIS — R339 Retention of urine, unspecified: Secondary | ICD-10-CM | POA: Diagnosis not present

## 2015-10-13 DIAGNOSIS — Z86010 Personal history of colonic polyps: Secondary | ICD-10-CM | POA: Diagnosis not present

## 2015-10-13 DIAGNOSIS — H918X3 Other specified hearing loss, bilateral: Secondary | ICD-10-CM | POA: Diagnosis not present

## 2015-10-13 DIAGNOSIS — K4020 Bilateral inguinal hernia, without obstruction or gangrene, not specified as recurrent: Secondary | ICD-10-CM | POA: Diagnosis not present

## 2015-10-13 DIAGNOSIS — N323 Diverticulum of bladder: Secondary | ICD-10-CM | POA: Diagnosis not present

## 2015-10-13 DIAGNOSIS — K219 Gastro-esophageal reflux disease without esophagitis: Secondary | ICD-10-CM | POA: Diagnosis not present

## 2015-10-13 DIAGNOSIS — K862 Cyst of pancreas: Secondary | ICD-10-CM | POA: Diagnosis not present

## 2015-10-13 DIAGNOSIS — N319 Neuromuscular dysfunction of bladder, unspecified: Secondary | ICD-10-CM | POA: Diagnosis not present

## 2015-10-13 DIAGNOSIS — I129 Hypertensive chronic kidney disease with stage 1 through stage 4 chronic kidney disease, or unspecified chronic kidney disease: Secondary | ICD-10-CM | POA: Diagnosis not present

## 2015-10-13 DIAGNOSIS — N1330 Unspecified hydronephrosis: Secondary | ICD-10-CM | POA: Diagnosis not present

## 2015-11-10 DIAGNOSIS — I129 Hypertensive chronic kidney disease with stage 1 through stage 4 chronic kidney disease, or unspecified chronic kidney disease: Secondary | ICD-10-CM | POA: Diagnosis not present

## 2015-11-10 DIAGNOSIS — N184 Chronic kidney disease, stage 4 (severe): Secondary | ICD-10-CM | POA: Diagnosis not present

## 2015-11-10 DIAGNOSIS — N319 Neuromuscular dysfunction of bladder, unspecified: Secondary | ICD-10-CM | POA: Diagnosis not present

## 2015-11-11 DIAGNOSIS — N319 Neuromuscular dysfunction of bladder, unspecified: Secondary | ICD-10-CM | POA: Diagnosis not present

## 2015-11-11 DIAGNOSIS — R339 Retention of urine, unspecified: Secondary | ICD-10-CM | POA: Diagnosis not present

## 2015-12-19 DIAGNOSIS — R339 Retention of urine, unspecified: Secondary | ICD-10-CM | POA: Diagnosis not present

## 2016-01-04 ENCOUNTER — Observation Stay (HOSPITAL_COMMUNITY)
Admission: EM | Admit: 2016-01-04 | Discharge: 2016-01-05 | Disposition: A | Discharge status: Home / Self Care | Payer: BLUE CROSS/BLUE SHIELD | Attending: Internal Medicine | Admitting: Internal Medicine

## 2016-01-04 ENCOUNTER — Encounter (HOSPITAL_COMMUNITY): Payer: Self-pay | Admitting: Emergency Medicine

## 2016-01-04 ENCOUNTER — Emergency Department (HOSPITAL_COMMUNITY): Payer: BLUE CROSS/BLUE SHIELD

## 2016-01-04 ENCOUNTER — Observation Stay (HOSPITAL_COMMUNITY): Payer: BLUE CROSS/BLUE SHIELD

## 2016-01-04 DIAGNOSIS — K4020 Bilateral inguinal hernia, without obstruction or gangrene, not specified as recurrent: Secondary | ICD-10-CM | POA: Diagnosis not present

## 2016-01-04 DIAGNOSIS — R1084 Generalized abdominal pain: Secondary | ICD-10-CM | POA: Diagnosis not present

## 2016-01-04 DIAGNOSIS — R339 Retention of urine, unspecified: Secondary | ICD-10-CM | POA: Insufficient documentation

## 2016-01-04 DIAGNOSIS — N319 Neuromuscular dysfunction of bladder, unspecified: Secondary | ICD-10-CM | POA: Insufficient documentation

## 2016-01-04 DIAGNOSIS — J432 Centrilobular emphysema: Secondary | ICD-10-CM | POA: Insufficient documentation

## 2016-01-04 DIAGNOSIS — N183 Chronic kidney disease, stage 3 unspecified: Secondary | ICD-10-CM | POA: Diagnosis present

## 2016-01-04 DIAGNOSIS — H9193 Unspecified hearing loss, bilateral: Secondary | ICD-10-CM | POA: Diagnosis not present

## 2016-01-04 DIAGNOSIS — I129 Hypertensive chronic kidney disease with stage 1 through stage 4 chronic kidney disease, or unspecified chronic kidney disease: Secondary | ICD-10-CM | POA: Diagnosis not present

## 2016-01-04 DIAGNOSIS — R109 Unspecified abdominal pain: Principal | ICD-10-CM

## 2016-01-04 DIAGNOSIS — K7689 Other specified diseases of liver: Secondary | ICD-10-CM | POA: Insufficient documentation

## 2016-01-04 DIAGNOSIS — Z79899 Other long term (current) drug therapy: Secondary | ICD-10-CM | POA: Diagnosis not present

## 2016-01-04 DIAGNOSIS — Z9359 Other cystostomy status: Secondary | ICD-10-CM | POA: Diagnosis not present

## 2016-01-04 DIAGNOSIS — K8689 Other specified diseases of pancreas: Secondary | ICD-10-CM | POA: Diagnosis not present

## 2016-01-04 DIAGNOSIS — K869 Disease of pancreas, unspecified: Secondary | ICD-10-CM | POA: Diagnosis not present

## 2016-01-04 DIAGNOSIS — I700 Atherosclerosis of aorta: Secondary | ICD-10-CM | POA: Insufficient documentation

## 2016-01-04 DIAGNOSIS — R112 Nausea with vomiting, unspecified: Secondary | ICD-10-CM | POA: Diagnosis not present

## 2016-01-04 DIAGNOSIS — Z87891 Personal history of nicotine dependence: Secondary | ICD-10-CM | POA: Diagnosis not present

## 2016-01-04 DIAGNOSIS — R1033 Periumbilical pain: Secondary | ICD-10-CM

## 2016-01-04 HISTORY — DX: Essential (primary) hypertension: I10

## 2016-01-04 LAB — CBC WITH DIFFERENTIAL/PLATELET
BASOS ABS: 0 10*3/uL (ref 0.0–0.1)
BASOS PCT: 0 %
EOS ABS: 0.1 10*3/uL (ref 0.0–0.7)
Eosinophils Relative: 1 %
HEMATOCRIT: 43.2 % (ref 39.0–52.0)
Hemoglobin: 14.7 g/dL (ref 13.0–17.0)
Lymphocytes Relative: 20 %
Lymphs Abs: 1.5 10*3/uL (ref 0.7–4.0)
MCH: 30.4 pg (ref 26.0–34.0)
MCHC: 34 g/dL (ref 30.0–36.0)
MCV: 89.4 fL (ref 78.0–100.0)
MONO ABS: 0.4 10*3/uL (ref 0.1–1.0)
MONOS PCT: 6 %
NEUTROS ABS: 5.2 10*3/uL (ref 1.7–7.7)
NEUTROS PCT: 73 %
Platelets: 378 10*3/uL (ref 150–400)
RBC: 4.83 MIL/uL (ref 4.22–5.81)
RDW: 12.8 % (ref 11.5–15.5)
WBC: 7.2 10*3/uL (ref 4.0–10.5)

## 2016-01-04 LAB — COMPREHENSIVE METABOLIC PANEL
ALK PHOS: 67 U/L (ref 38–126)
ALT: 24 U/L (ref 17–63)
ANION GAP: 7 (ref 5–15)
AST: 29 U/L (ref 15–41)
Albumin: 3.7 g/dL (ref 3.5–5.0)
BILIRUBIN TOTAL: 0.7 mg/dL (ref 0.3–1.2)
BUN: 30 mg/dL — ABNORMAL HIGH (ref 6–20)
CALCIUM: 9.4 mg/dL (ref 8.9–10.3)
CO2: 28 mmol/L (ref 22–32)
Chloride: 101 mmol/L (ref 101–111)
Creatinine, Ser: 3 mg/dL — ABNORMAL HIGH (ref 0.61–1.24)
GFR calc Af Amer: 23 mL/min — ABNORMAL LOW (ref >60)
GFR, EST NON AFRICAN AMERICAN: 20 mL/min — AB (ref >60)
Glucose, Bld: 107 mg/dL — ABNORMAL HIGH (ref 65–99)
POTASSIUM: 4.2 mmol/L (ref 3.5–5.1)
Sodium: 136 mmol/L (ref 135–145)
TOTAL PROTEIN: 8.1 g/dL (ref 6.5–8.1)

## 2016-01-04 LAB — URINALYSIS, ROUTINE W REFLEX MICROSCOPIC
BILIRUBIN URINE: NEGATIVE
Glucose, UA: NEGATIVE mg/dL
KETONES UR: NEGATIVE mg/dL
NITRITE: POSITIVE — AB
PH: 8.5 — AB (ref 5.0–8.0)
PROTEIN: 100 mg/dL — AB
Specific Gravity, Urine: 1.017 (ref 1.005–1.030)

## 2016-01-04 LAB — URINE MICROSCOPIC-ADD ON: SQUAMOUS EPITHELIAL / LPF: NONE SEEN

## 2016-01-04 LAB — LACTIC ACID, PLASMA: LACTIC ACID, VENOUS: 1.2 mmol/L (ref 0.5–1.9)

## 2016-01-04 LAB — PROTIME-INR
INR: 1.19
Prothrombin Time: 15.2 seconds (ref 11.4–15.2)

## 2016-01-04 LAB — LIPASE, BLOOD: LIPASE: 28 U/L (ref 11–51)

## 2016-01-04 LAB — TSH: TSH: 0.624 u[IU]/mL (ref 0.350–4.500)

## 2016-01-04 MED ORDER — MORPHINE SULFATE (PF) 2 MG/ML IV SOLN
4.0000 mg | Freq: Once | INTRAVENOUS | Status: AC
  Administered 2016-01-04: 4 mg via INTRAVENOUS
  Filled 2016-01-04: qty 2

## 2016-01-04 MED ORDER — FAMOTIDINE IN NACL 20-0.9 MG/50ML-% IV SOLN
20.0000 mg | Freq: Once | INTRAVENOUS | Status: AC
  Administered 2016-01-04: 20 mg via INTRAVENOUS
  Filled 2016-01-04: qty 50

## 2016-01-04 MED ORDER — HYDRALAZINE HCL 20 MG/ML IJ SOLN
10.0000 mg | Freq: Four times a day (QID) | INTRAMUSCULAR | Status: DC | PRN
  Administered 2016-01-04: 10 mg via INTRAVENOUS
  Filled 2016-01-04: qty 1

## 2016-01-04 MED ORDER — SODIUM CHLORIDE 0.9 % IV SOLN
INTRAVENOUS | Status: DC
  Administered 2016-01-04: 19:00:00 via INTRAVENOUS

## 2016-01-04 MED ORDER — HYDROMORPHONE HCL 1 MG/ML IJ SOLN
1.0000 mg | INTRAMUSCULAR | Status: DC | PRN
Start: 2016-01-04 — End: 2016-01-05

## 2016-01-04 MED ORDER — AMLODIPINE BESYLATE 5 MG PO TABS
2.5000 mg | ORAL_TABLET | Freq: Every day | ORAL | Status: DC
Start: 2016-01-04 — End: 2016-01-05
  Administered 2016-01-04 – 2016-01-05 (×2): 2.5 mg via ORAL
  Filled 2016-01-04 (×2): qty 1

## 2016-01-04 MED ORDER — ACETAMINOPHEN 650 MG RE SUPP: 650.0000 mg | Freq: Four times a day (QID) | RECTAL | Status: DC | PRN

## 2016-01-04 MED ORDER — HEPARIN SODIUM (PORCINE) 5000 UNIT/ML IJ SOLN
5000.0000 [IU] | Freq: Three times a day (TID) | INTRAMUSCULAR | Status: DC
  Administered 2016-01-04 – 2016-01-05 (×2): 5000 [IU] via SUBCUTANEOUS
  Filled 2016-01-04 (×2): qty 1

## 2016-01-04 MED ORDER — SIMETHICONE 80 MG PO CHEW
80.0000 mg | CHEWABLE_TABLET | Freq: Four times a day (QID) | ORAL | Status: DC | PRN
  Administered 2016-01-04: 80 mg via ORAL
  Filled 2016-01-04: qty 1

## 2016-01-04 MED ORDER — OXYBUTYNIN CHLORIDE ER 5 MG PO TB24
10.0000 mg | ORAL_TABLET | Freq: Every day | ORAL | Status: DC
  Administered 2016-01-04: 10 mg via ORAL
  Filled 2016-01-04: qty 2

## 2016-01-04 MED ORDER — ONDANSETRON HCL 4 MG/2ML IJ SOLN: 4.0000 mg | Freq: Four times a day (QID) | INTRAMUSCULAR | Status: DC | PRN

## 2016-01-04 MED ORDER — SIMETHICONE 40 MG/0.6ML PO SUSP
40.0000 mg | Freq: Once | ORAL | Status: AC
  Administered 2016-01-04: 40 mg via ORAL
  Filled 2016-01-04: qty 0.6

## 2016-01-04 MED ORDER — SORBITOL 70 % SOLN
960.0000 mL | TOPICAL_OIL | Freq: Once | ORAL | Status: AC
  Administered 2016-01-04: 960 mL via RECTAL
  Filled 2016-01-04: qty 240

## 2016-01-04 MED ORDER — ONDANSETRON HCL 4 MG PO TABS: 4.0000 mg | ORAL_TABLET | Freq: Four times a day (QID) | ORAL | Status: DC | PRN

## 2016-01-04 MED ORDER — ACETAMINOPHEN 325 MG PO TABS: 650.0000 mg | ORAL_TABLET | Freq: Four times a day (QID) | ORAL | Status: DC | PRN

## 2016-01-04 MED ORDER — HYDRALAZINE HCL 20 MG/ML IJ SOLN: 5.0000 mg | Freq: Four times a day (QID) | INTRAMUSCULAR | Status: DC | PRN

## 2016-01-04 MED ORDER — ONDANSETRON HCL 4 MG/2ML IJ SOLN
4.0000 mg | Freq: Once | INTRAMUSCULAR | Status: AC
  Administered 2016-01-04: 4 mg via INTRAVENOUS
  Filled 2016-01-04: qty 2

## 2016-01-04 MED ORDER — HYOSCYAMINE SULFATE ER 0.375 MG PO TB12
0.3750 mg | ORAL_TABLET | Freq: Two times a day (BID) | ORAL | Status: DC
  Administered 2016-01-04 – 2016-01-05 (×2): 0.375 mg via ORAL
  Filled 2016-01-04 (×2): qty 1

## 2016-01-04 MED ORDER — SODIUM CHLORIDE 0.9 % IV BOLUS (SEPSIS)
500.0000 mL | Freq: Once | INTRAVENOUS | Status: AC
  Administered 2016-01-04: 500 mL via INTRAVENOUS

## 2016-01-04 MED ORDER — HYDROCODONE-ACETAMINOPHEN 5-325 MG PO TABS: 1.0000 | ORAL_TABLET | ORAL | Status: DC | PRN

## 2016-01-04 MED ORDER — SODIUM CHLORIDE 0.9 % IV SOLN
INTRAVENOUS | Status: DC
  Administered 2016-01-04: 12:00:00 via INTRAVENOUS

## 2016-01-04 MED ORDER — PANTOPRAZOLE SODIUM 40 MG PO TBEC
40.0000 mg | DELAYED_RELEASE_TABLET | Freq: Once | ORAL | Status: AC
  Administered 2016-01-04: 40 mg via ORAL
  Filled 2016-01-04: qty 1

## 2016-01-04 NOTE — H&P (Addendum)
History and Physical    DONTREAL TAUTE [id number] DOB: [birth year] DOA: 01/04/2016  PCP: No primary care provider on file.  Patient coming from: Home  Chief Complaint: Abdominal pain  HPI: Gerald Thompson is a 70 y.o. male with medical history significant of CKD stage III, dysfunctional bladder status post suprapubic catheter and hypertension came to the hospital complaining about abdominal pain. Patient was in his usual state of health until this morning, he had breakfast at [facility], soon after that he started to have abdominal pain with nausea, he vomited 3 times. Initially he thought his suprapubic catheter clogged, but reported that he continues to have good urine output. Initial evaluation in the hospital CT scan was done showed no acute findings, patient to be observed overnight, still complaining about severe 8 /10 pain.  ED Course:  Vitals: WNL, blood pressure elevated at 160/93 on admission Labs: WNL except creatinine of 3.0 Imaging: CT of abdomen/pelvis showed no acute findings Interventions: Referred to observation  Review of Systems:  Constitutional: negative for anorexia, fevers and sweats Eyes: negative for irritation, redness and visual disturbance Ears, nose, mouth, throat, and face: negative for earaches, epistaxis, nasal congestion and sore throat Respiratory: negative for cough, dyspnea on exertion, sputum and wheezing Cardiovascular: negative for chest pain, dyspnea, lower extremity edema, orthopnea, palpitations and syncope Gastrointestinal: Per history of present illness including nausea/vomiting/abdominal pain Genitourinary:negative for dysuria, frequency and hematuria Hematologic/lymphatic: negative for bleeding, easy bruising and lymphadenopathy Musculoskeletal:negative for arthralgias, muscle weakness and stiff joints Neurological: negative for coordination problems, gait problems, headaches and weakness Endocrine: negative for diabetic symptoms including  polydipsia, polyuria and weight loss Allergic/Immunologic: negative for anaphylaxis, hay fever and urticaria  Past Medical History:  Diagnosis Date  . Hypertension     History reviewed. No pertinent surgical history.   reports that he has quit smoking. He has never used smokeless tobacco. He reports that he does not drink alcohol. His drug history is not on file.  No Known Allergies  Family history Noncontributory  Prior to Admission medications   Medication Sig Start Date End Date Taking? Authorizing Provider  HYDROcodone-acetaminophen (NORCO/VICODIN) 5-325 MG tablet Take 1 tablet by mouth every 6 (six) hours as needed. 11/14/15  Yes Historical Provider, MD  senna-docusate (SENOKOT-S) 8.6-50 MG tablet Take 1 tablet by mouth daily. 11/14/15  Yes Historical Provider, MD  amLODipine (NORVASC) 2.5 MG tablet Take 2.5 mg by mouth.    Historical Provider, MD  buPROPion (WELLBUTRIN) 75 MG tablet Take 150 mg by mouth.    Historical Provider, MD  ferrous sulfate 325 (65 FE) MG tablet Take 325 mg by mouth daily with breakfast.    Historical Provider, MD  Multiple Vitamin (MULTI-VITAMINS) TABS Take 1 tablet by mouth every morning.    Historical Provider, MD  oxybutynin (DITROPAN XL) 10 MG 24 hr tablet Take 10 mg by mouth. 04/21/15   Historical Provider, MD  ranitidine (ZANTAC) 150 MG capsule Take 150 mg by mouth 2 (two) times daily.    Historical Provider, MD    Physical Exam:  Vitals:   01/04/16 1116 01/04/16 1217 01/04/16 1300 01/04/16 1534  BP:  138/96 161/93 181/100  Pulse:  78 75 65  Resp:  18 18 18   Temp:      TempSrc:      SpO2:  99% 97% 100%  Weight: 74.8 kg (165 lb)     Height: 6\' 1"  (1.854 m)       Constitutional: NAD, calm, comfortable Eyes: PERRL, lids and  conjunctivae normal ENMT: Mucous membranes are moist. Posterior pharynx clear of any exudate or lesions.Normal dentition.  Neck: normal, supple, no masses, no thyromegaly Respiratory: clear to auscultation bilaterally,  no wheezing, no crackles. Normal respiratory effort. No accessory muscle use.  Cardiovascular: Regular rate and rhythm, no murmurs / rubs / gallops. No extremity edema. 2+ pedal pulses. No carotid bruits.  Abdomen: no tenderness, no masses palpated. No hepatosplenomegaly. Bowel sounds positive.  Musculoskeletal: no clubbing / cyanosis. No joint deformity upper and lower extremities. Good ROM, no contractures. Normal muscle tone.  Skin: no rashes, lesions, ulcers. No induration Neurologic: CN 2-12 grossly intact. Sensation intact, DTR normal. Strength 5/5 in all 4.  Psychiatric: Normal judgment and insight. Alert and oriented x 3. Normal mood.   Labs on Admission: I have personally reviewed following labs and imaging studies  CBC:  Recent Labs Lab 01/04/16 1201  WBC 7.2  NEUTROABS 5.2  HGB 14.7  HCT 43.2  MCV 89.4  PLT [id number]   Basic Metabolic Panel:  Recent Labs Lab 01/04/16 1201  NA 136  K 4.2  CL 101  CO2 28  GLUCOSE 107*  BUN 30*  CREATININE 3.00*  CALCIUM 9.4   GFR: Estimated Creatinine Clearance: 24.6 mL/min (by C-G formula based on SCr of 3 mg/dL (H)). Liver Function Tests:  Recent Labs Lab 01/04/16 1201  AST 29  ALT 24  ALKPHOS 67  BILITOT 0.7  PROT 8.1  ALBUMIN 3.7    Recent Labs Lab 01/04/16 1201  LIPASE 28   No results for input(s): AMMONIA in the last 168 hours. Coagulation Profile: No results for input(s): INR, PROTIME in the last 168 hours. Cardiac Enzymes: No results for input(s): CKTOTAL, CKMB, CKMBINDEX, TROPONINI in the last 168 hours. BNP (last 3 results) No results for input(s): PROBNP in the last 8760 hours. HbA1C: No results for input(s): HGBA1C in the last 72 hours. CBG: No results for input(s): GLUCAP in the last 168 hours. Lipid Profile: No results for input(s): CHOL, HDL, LDLCALC, TRIG, CHOLHDL, LDLDIRECT in the last 72 hours. Thyroid Function Tests: No results for input(s): TSH, T4TOTAL, FREET4, T3FREE, THYROIDAB in the last  72 hours. Anemia Panel: No results for input(s): VITAMINB12, FOLATE, FERRITIN, TIBC, IRON, RETICCTPCT in the last 72 hours. Urine analysis:    Component Value Date/Time   COLORURINE YELLOW 01/04/2016 1229   APPEARANCEUR TURBID (A) 01/04/2016 1229   LABSPEC 1.017 01/04/2016 1229   PHURINE 8.5 (H) 01/04/2016 1229   GLUCOSEU NEGATIVE 01/04/2016 1229   HGBUR SMALL (A) 01/04/2016 1229   BILIRUBINUR NEGATIVE 01/04/2016 1229   KETONESUR NEGATIVE 01/04/2016 1229   PROTEINUR 100 (A) 01/04/2016 1229   NITRITE POSITIVE (A) 01/04/2016 1229   LEUKOCYTESUR LARGE (A) 01/04/2016 1229   Sepsis Labs: !!!!!!!!!!!!!!!!!!!!!!!!!!!!!!!!!!!!!!!!!!!! Invalid input(s): PROCALCITONIN, LACTICIDVEN No results found for this or any previous visit (from the past 240 hour(s)).   Radiological Exams on Admission: Ct Renal Stone Study  Result Date: 01/04/2016 CLINICAL DATA:  70 year old male with a history of urinary retention status post suprapubic catheter placement 3 weeks prior, presenting with abdominal pain and decreased urine output in catheter bag. EXAM: CT ABDOMEN AND PELVIS WITHOUT CONTRAST TECHNIQUE: Multidetector CT imaging of the abdomen and pelvis was performed following the standard protocol without IV contrast. COMPARISON:  None. FINDINGS: Lower chest: Centrilobular emphysema is present at the lung bases. Subpleural 4 mm pulmonary nodule associated with the right major fissure (series 6/ image 20). Nonspecific patchy subpleural reticulation at both lung bases. Hepatobiliary: Normal liver  size. Small scattered simple liver cysts measuring up to 1.8 cm in the anterior left liver lobe. Several additional subcentimeter hypodense liver lesions scattered throughout the liver, too small to characterize, which require no further follow-up. Normal gallbladder with no radiopaque cholelithiasis. No biliary ductal dilatation. Pancreas: Low-attenuation 1.1 cm lesion in the posterior pancreatic tail (series 2/ image 21).  No additional pancreatic lesions. No main pancreatic duct dilation. Spleen: Normal size. No mass. Adrenals/Urinary Tract: No discrete adrenal nodule. Bilateral renal atrophy, asymmetrically severe on the right. No right hydronephrosis. Fullness of the central left renal collecting system without overt left hydronephrosis. Hyperdense 0.8 cm renal cortical lesion in the interpolar right kidney. No additional contour deforming renal lesions. No renal stones. Normal caliber ureters, with no ureteral stones. Bladder is completely collapsed by indwelling suprapubic catheter. Expected scattered gas in the bladder lumen from instrumentation. No bladder stones. No definite bladder wall thickening. Stomach/Bowel: Grossly normal stomach. There are small bilateral inguinal hernias, both of which contain portions of pelvic small bowel loops. No small bowel dilatation or full caliber transition. No small bowel wall thickening or pneumatosis. Appendix is not discretely visualized. Normal large bowel with no diverticulosis, large bowel wall thickening or pericolonic fat stranding. Vascular/Lymphatic: Atherosclerotic nonaneurysmal abdominal aorta. No pathologically enlarged lymph nodes in the abdomen or pelvis. Reproductive: Top-normal size prostate with coarse nonspecific internal prostatic calcifications. Other: No pneumoperitoneum. Diffuse haziness of the mesenteric fat. No focal fluid collections. Musculoskeletal: No aggressive appearing focal osseous lesions. Mild-to-moderate thoracolumbar spondylosis, most prominent at L5-S1. IMPRESSION: 1. Bladder is collapsed by indwelling suprapubic catheter. Fullness of the central left renal collecting system without overt left hydronephrosis. No right hydronephrosis. Bilateral renal atrophy, asymmetrically severe on the right. 2. Small bilateral inguinal hernias containing pelvic small bowel loops. No evidence of small-bowel obstruction or ischemia. 3. Diffuse haziness of the mesenteric  fat. No pneumoperitoneum. No focal fluid collections. Findings could indicate an infectious or inflammatory peritonitis. 4. **An incidental finding of potential clinical significance has been found. Low-attenuation 1.1 cm posterior pancreatic tail lesion without pancreatic duct dilation. Recommend follow up pre and post contrast MRI abdomen/MRCP or pancreatic protocol CT in 2 years. This recommendation follows ACR consensus guidelines: Management of Incidental Pancreatic Cysts: A White Paper of the ACR Incidental Findings Committee. [place] [id number]. ** 5. Hyperdense 0.8 cm interpolar right renal cortical lesion, too small to characterize, no further workup recommended. This recommendation follows ACR consensus guidelines: Management of the Incidental Renal Mass on CT: A White Paper of the ACR Incidental Findings Committee. J Am Coll Radiol 2017; article in press. 6. Subpleural 4 mm right lung base pulmonary nodule. No follow-up needed if patient is low-risk. Non-contrast chest CT can be considered in 12 months if patient is high-risk. This recommendation follows the consensus statement: Guidelines for Management of Incidental Pulmonary Nodules Detected on CT Images: From the Fleischner Society 2017; Radiology 2017; 284:228-243. 7. Additional findings include centrilobular emphysema and aortic atherosclerosis. Electronically Signed   By: Ilona Sorrel M.D.   On: 01/04/2016 14:35    EKG: Independently reviewed.   Assessment/Plan Principal Problem:   Abdominal pain Active Problems:   Chronic suprapubic catheter (HCC)   CKD (chronic kidney disease) stage 3, GFR 30-59 ml/min   Nausea & vomiting   Pancreatic lesion    Abdominal pain -Started after breakfast this morning, with nausea and 3 episodes of nonbloody vomiting. -CT scan of abdomen/pelvis without acute findings, normal lipase. -Received morphine in the emergency department without relief  of his symptoms. -Patient describes  colicky abdominal pain, started on Mylicon and Levsin. -Treated symptomatically for pain and nausea with opioid pain medications and antiemetics.  Suprapubic catheter -Since September 2017, follows with Dr. Amalia Hailey at [facility] -Urine has green hue and urinalysis has many bacteria suggesting colonization, no fever/chills or leukocytosis.  Nausea and vomiting -Along with abdominal pain, unclear etiology but this could be transient gastroenteritis. -CT scan no findings of gastritis or other acute findings, gallbladder is normal, treat symptomatically.  HTN -Restart home medications, slightly elevated, likely secondary to pain.  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   DVT prophylaxis: SQ Heparin Code Status: Full code Family Communication: Plan D/W patient Disposition Plan: Home, likely can be discharged in a.m. if pain improved Consults called:  Admission status: Observation   [facility] A MD [facility] Pager [phone]  If 7PM-7AM, please contact night-coverage [email or web address] Password [facility]  01/04/2016, 3:54 PM

## 2016-01-04 NOTE — ED Notes (Signed)
Patient transported to CT 

## 2016-01-04 NOTE — ED Notes (Signed)
Per MD hold enema until she hears from general surgery.

## 2016-01-04 NOTE — ED Notes (Signed)
Patient on bedside commode attempting BM

## 2016-01-04 NOTE — ED Notes (Signed)
Hospitalist at bedside 

## 2016-01-04 NOTE — ED Notes (Signed)
Surgeon at bedside.  

## 2016-01-04 NOTE — ED Provider Notes (Signed)
[facility] DEPT Provider Note   CSN: [id number] Arrival date & time: 01/04/16  1054     History   Chief Complaint Chief Complaint  Patient presents with  . Suprapubic Catheter Issue    HPI [name] is a 70 y.o. male.  HPI Patient reports he started developing severe cramping discomfort in his central abdomen this morning around 9 AM. Within approximately 3 hours he started vomiting multiple times. He emptied the contents of the stomach. The pain continues to come in spasmodic waves of intense cramping. Patient thought this was due to malfunction of his suprapubic catheter. He reports the urine is coming out but he still wonders if it's blocked. He reports he's had regular bowel movements. His wife reports that he takes him plenty of fluids and fiber and has not had problems with constipation. Patient ports he has known inguinal hernias but they don't cause him problems or pain. He reports he doesn't feel like the pain is in that area. He does report a family history of colon problems and colon cancer. He denies he's ever had similar pain attacks. He does report occasionally he has gas and simethicone helps. He denies history of reflux or PUD. Past Medical History:  Diagnosis Date  . Hypertension     Patient Active Problem List   Diagnosis Date Noted  . Abdominal pain 01/04/2016    History reviewed. No pertinent surgical history.     Home Medications    Prior to Admission medications   Medication Sig Start Date End Date Taking? Authorizing Provider  amLODipine (NORVASC) 2.5 MG tablet Take 2.5 mg by mouth.    Historical Provider, MD  buPROPion (WELLBUTRIN) 75 MG tablet Take 150 mg by mouth.    Historical Provider, MD  cephALEXin (KEFLEX) 500 MG capsule Take 1 capsule (500 mg total) by mouth 4 (four) times daily. 08/04/15   Merryl Hacker, MD  ciprofloxacin-dexamethasone (CIPRODEX) otic suspension Place 2 drops into both ears at bedtime.    Historical Provider, MD    ferrous sulfate 325 (65 FE) MG tablet Take 325 mg by mouth daily with breakfast.    Historical Provider, MD  oxybutynin (DITROPAN XL) 10 MG 24 hr tablet Take 10 mg by mouth. 04/21/15   Historical Provider, MD  ranitidine (ZANTAC) 150 MG capsule Take 150 mg by mouth 2 (two) times daily.    Historical Provider, MD    Family History No family history on file.  Social History Social History  Substance Use Topics  . Smoking status: Former Research scientist (life sciences)  . Smokeless tobacco: Never Used  . Alcohol use No     Allergies   Patient has no known allergies.   Review of Systems Review of Systems 10 Systems reviewed and are negative for acute change except as noted in the HPI.   Physical Exam Updated Vital Signs BP 161/93   Pulse 75   Temp 97.6 F (36.4 C) (Oral)   Resp 18   Ht 6\' 1"  (1.854 m)   Wt 165 lb (74.8 kg)   SpO2 97%   BMI 21.77 kg/m   Physical Exam  Constitutional: He is oriented to person, place, and time.  Patient is thin but well-nourished well-developed. He does appear to be in severe pain. No respiratory distress. Mental status is clear.  HENT:  Head: Normocephalic and atraumatic.  Mouth/Throat: Oropharynx is clear and moist.  Eyes: Conjunctivae and EOM are normal.  Neck: Neck supple.  Cardiovascular: Normal rate, regular rhythm, normal heart sounds  and intact distal pulses.   Pulmonary/Chest: Effort normal and breath sounds normal.  Abdominal:  Suprapubic catheter is in place. I do not appreciate any distention over the suprapubic region. The patient does have a moderately large left inguinal hernia which is protruding. This reduces easily with light pressure and patient does not perceive this to be painful. Moderate pain in the central abdomen. No guarding or appreciable mass.  Genitourinary:  Genitourinary Comments: Rectal exam: No formed stool in the vault. Just trace yellow-brown. Prostate is nontender.  Musculoskeletal: Normal range of motion. He exhibits no  edema, tenderness or deformity.  Neurological: He is alert and oriented to person, place, and time. No cranial nerve deficit. He exhibits normal muscle tone. Coordination normal.  Skin: Skin is warm and dry.  Psychiatric: He has a normal mood and affect.     ED Treatments / Results  Labs (all labs ordered are listed, but only abnormal results are displayed) Labs Reviewed  COMPREHENSIVE METABOLIC PANEL - Abnormal; Notable for the following:       Result Value   Glucose, Bld 107 (*)    BUN 30 (*)    Creatinine, Ser 3.00 (*)    GFR calc non Af Amer 20 (*)    GFR calc Af Amer 23 (*)    All other components within normal limits  URINALYSIS, ROUTINE W REFLEX MICROSCOPIC (NOT AT [facility]) - Abnormal; Notable for the following:    APPearance TURBID (*)    pH 8.5 (*)    Hgb urine dipstick SMALL (*)    Protein, ur 100 (*)    Nitrite POSITIVE (*)    Leukocytes, UA LARGE (*)    All other components within normal limits  URINE MICROSCOPIC-ADD ON - Abnormal; Notable for the following:    Bacteria, UA MANY (*)    Crystals TRIPLE PHOSPHATE CRYSTALS (*)    All other components within normal limits  LIPASE, BLOOD  CBC WITH DIFFERENTIAL/PLATELET    EKG  EKG Interpretation None       Radiology Ct Renal Stone Study  Result Date: 01/04/2016 CLINICAL DATA:  70 year old male with a history of urinary retention status post suprapubic catheter placement 3 weeks prior, presenting with abdominal pain and decreased urine output in catheter bag. EXAM: CT ABDOMEN AND PELVIS WITHOUT CONTRAST TECHNIQUE: Multidetector CT imaging of the abdomen and pelvis was performed following the standard protocol without IV contrast. COMPARISON:  None. FINDINGS: Lower chest: Centrilobular emphysema is present at the lung bases. Subpleural 4 mm pulmonary nodule associated with the right major fissure (series 6/ image 20). Nonspecific patchy subpleural reticulation at both lung bases. Hepatobiliary: Normal liver size. Small  scattered simple liver cysts measuring up to 1.8 cm in the anterior left liver lobe. Several additional subcentimeter hypodense liver lesions scattered throughout the liver, too small to characterize, which require no further follow-up. Normal gallbladder with no radiopaque cholelithiasis. No biliary ductal dilatation. Pancreas: Low-attenuation 1.1 cm lesion in the posterior pancreatic tail (series 2/ image 21). No additional pancreatic lesions. No main pancreatic duct dilation. Spleen: Normal size. No mass. Adrenals/Urinary Tract: No discrete adrenal nodule. Bilateral renal atrophy, asymmetrically severe on the right. No right hydronephrosis. Fullness of the central left renal collecting system without overt left hydronephrosis. Hyperdense 0.8 cm renal cortical lesion in the interpolar right kidney. No additional contour deforming renal lesions. No renal stones. Normal caliber ureters, with no ureteral stones. Bladder is completely collapsed by indwelling suprapubic catheter. Expected scattered gas in the bladder lumen from  instrumentation. No bladder stones. No definite bladder wall thickening. Stomach/Bowel: Grossly normal stomach. There are small bilateral inguinal hernias, both of which contain portions of pelvic small bowel loops. No small bowel dilatation or full caliber transition. No small bowel wall thickening or pneumatosis. Appendix is not discretely visualized. Normal large bowel with no diverticulosis, large bowel wall thickening or pericolonic fat stranding. Vascular/Lymphatic: Atherosclerotic nonaneurysmal abdominal aorta. No pathologically enlarged lymph nodes in the abdomen or pelvis. Reproductive: Top-normal size prostate with coarse nonspecific internal prostatic calcifications. Other: No pneumoperitoneum. Diffuse haziness of the mesenteric fat. No focal fluid collections. Musculoskeletal: No aggressive appearing focal osseous lesions. Mild-to-moderate thoracolumbar spondylosis, most prominent at  L5-S1. IMPRESSION: 1. Bladder is collapsed by indwelling suprapubic catheter. Fullness of the central left renal collecting system without overt left hydronephrosis. No right hydronephrosis. Bilateral renal atrophy, asymmetrically severe on the right. 2. Small bilateral inguinal hernias containing pelvic small bowel loops. No evidence of small-bowel obstruction or ischemia. 3. Diffuse haziness of the mesenteric fat. No pneumoperitoneum. No focal fluid collections. Findings could indicate an infectious or inflammatory peritonitis. 4. **An incidental finding of potential clinical significance has been found. Low-attenuation 1.1 cm posterior pancreatic tail lesion without pancreatic duct dilation. Recommend follow up pre and post contrast MRI abdomen/MRCP or pancreatic protocol CT in 2 years. This recommendation follows ACR consensus guidelines: Management of Incidental Pancreatic Cysts: A White Paper of the ACR Incidental Findings Committee. [place] [id number]. ** 5. Hyperdense 0.8 cm interpolar right renal cortical lesion, too small to characterize, no further workup recommended. This recommendation follows ACR consensus guidelines: Management of the Incidental Renal Mass on CT: A White Paper of the ACR Incidental Findings Committee. J Am Coll Radiol 2017; article in press. 6. Subpleural 4 mm right lung base pulmonary nodule. No follow-up needed if patient is low-risk. Non-contrast chest CT can be considered in 12 months if patient is high-risk. This recommendation follows the consensus statement: Guidelines for Management of Incidental Pulmonary Nodules Detected on CT Images: From the Fleischner Society 2017; Radiology 2017; 284:228-243. 7. Additional findings include centrilobular emphysema and aortic atherosclerosis. Electronically Signed   By: Ilona Sorrel M.D.   On: 01/04/2016 14:35    Procedures Procedures (including critical care time)  Medications Ordered in ED Medications  0.9 %  sodium  chloride infusion ( Intravenous New Bag/Given 01/04/16 1212)  sorbitol, milk of mag, mineral oil, glycerin (SMOG) enema (not administered)  simethicone (MYLICON) 40 [id number] suspension 40 mg (not administered)  famotidine (PEPCID) IVPB 20 mg premix (20 mg Intravenous New Bag/Given 01/04/16 1529)  morphine 2 MG/ML injection 4 mg (4 mg Intravenous Given 01/04/16 1213)  ondansetron (ZOFRAN) injection 4 mg (4 mg Intravenous Given 01/04/16 1209)  sodium chloride 0.9 % bolus 500 mL (0 mLs Intravenous Stopped 01/04/16 1344)  morphine 2 MG/ML injection 4 mg (4 mg Intravenous Given 01/04/16 1338)  ondansetron (ZOFRAN) injection 4 mg (4 mg Intravenous Given 01/04/16 1338)  pantoprazole (PROTONIX) EC tablet 40 mg (40 mg Oral Given 01/04/16 1529)     Initial Impression / Assessment and Plan / ED Course  I have reviewed the triage vital signs and the nursing notes.  Pertinent labs & imaging results that were available during my care of the patient were reviewed by me and considered in my medical decision making (see chart for details).  Clinical Course   Shortly after arrival, patient did have several episodes of emesis.   Consult: Dr. Hassell Done general surgery (15:10) we'll proceed with symptomatic treatment  with enema and plan for observation hospitalist service with surgical consult. Consult: [facility] hospitalist for admission  Final Clinical Impressions(s) / ED Diagnoses   Final diagnoses:  Periumbilical abdominal pain   Patient has had acute and severe onset of pain. It is colicky and spasmodic in nature. Several hours after onset of pain patient had recurrent emesis. Pain has not improved with morphine administration. Patient's initial impression was that it was his suprapubic catheter. Catheter site is normal without drainage or discharge, ultrasound, bladder scan and CT do not show any bladder distention. He does not have tenderness to palpation over the bladder and symptoms are in the mid abdomen. At this  time, etiology is uncertain. Patient shows no evidence of fecal impaction or prostatitis on physical exam. With continued disproportional pain despite administration of pain medications, oral surgery has been consulted and plan will be for patient admission for observation. New Prescriptions New Prescriptions   No medications on file     Charlesetta Shanks, MD 01/13/16 [phone]

## 2016-01-04 NOTE — Consult Note (Signed)
Chief Complaint:  Upper abdominal pain since this morning with nausea and vomiting  History of Present Illness:  [facility] is an 70 y.o. male who got abdominal pain with nausea and vomiting shortly after breakfast this morning.  Upper abdominal pain has been persistant and the patient initially thought it secondary to an obstructed indwelling suprapubic tube.  He is admitted at this time to hospitalist service for observation and is seen by me in the ER.    Past Medical History:  Diagnosis Date  . Hypertension     History reviewed. No pertinent surgical history.  Current Facility-Administered Medications  Medication Dose Route Frequency Provider Last Rate Last Dose  . 0.9 %  sodium chloride infusion   Intravenous Continuous Charlesetta Shanks, MD 100 mL/hr at 01/04/16 1212     Patient has no known allergies. No family history on file. Social History:   reports that he has quit smoking. He has never used smokeless tobacco. He reports that he does not drink alcohol. His drug history is not on file.   REVIEW OF SYSTEMS : Negative except for bladder atony followed by Dr. Amalia Hailey at [facility]  Physical Exam:   Blood pressure 181/100, pulse 65, temperature 97.6 F (36.4 C), temperature source Oral, resp. rate 18, height 6' 1"  (1.854 m), weight 74.8 kg (165 lb), SpO2 100 %. Body mass index is 21.77 kg/m.  Gen:  WDWN AAM uncomfortable  Neurological: Alert and oriented to person, place, and time. Motor and sensory function is grossly intact  Head: Normocephalic and atraumatic.  Eyes: Conjunctivae are normal. Pupils are equal, round, and reactive to light. No scleral icterus.  Neck: Normal range of motion. Neck supple. No tracheal deviation or thyromegaly present.  Cardiovascular:  SR without murmurs or gallops.  No carotid bruits Breast:  Not examined Respiratory: Effort normal.  No respiratory distress. No chest wall tenderness. Breath sounds normal.  No wheezes, rales or rhonchi.  Abdomen:   No rebound or guarding but complaining of upper abdominal pain. GU:  Suprapubic tube in place.  Bilateral inguinal herniae without obstruction Musculoskeletal: Normal range of motion. Extremities are nontender. No cyanosis, edema or clubbing noted Lymphadenopathy: No cervical, preauricular, postauricular or axillary adenopathy is present Skin: Skin is warm and dry. No rash noted. No diaphoresis. No erythema. No pallor. Pscyh: Normal mood and affect. Behavior is normal. Judgment and thought content normal.   LABORATORY RESULTS: Results for orders placed or performed during the hospital encounter of 01/04/16 (from the past 48 hour(s))  Comprehensive metabolic panel     Status: Abnormal   Collection Time: 01/04/16 12:01 PM  Result Value Ref Range   Sodium 136 135 - 145 mmol/L   Potassium 4.2 3.5 - 5.1 mmol/L   Chloride 101 101 - 111 mmol/L   CO2 28 22 - 32 mmol/L   Glucose, Bld 107 (H) 65 - 99 mg/dL   BUN 30 (H) 6 - 20 mg/dL   Creatinine, Ser 3.00 (H) 0.61 - 1.24 mg/dL   Calcium 9.4 8.9 - 10.3 mg/dL   Total Protein 8.1 6.5 - 8.1 g/dL   Albumin 3.7 3.5 - 5.0 g/dL   AST 29 15 - 41 U/L   ALT 24 17 - 63 U/L   Alkaline Phosphatase 67 38 - 126 U/L   Total Bilirubin 0.7 0.3 - 1.2 mg/dL   GFR calc non Af Amer 20 (L) >60 mL/min   GFR calc Af Amer 23 (L) >60 mL/min    Comment: (NOTE) The  eGFR has been calculated using the CKD EPI equation. This calculation has not been validated in all clinical situations. eGFR's persistently <60 mL/min signify possible Chronic Kidney Disease.    Anion gap 7 5 - 15  Lipase, blood     Status: None   Collection Time: 01/04/16 12:01 PM  Result Value Ref Range   Lipase 28 11 - 51 U/L  CBC with Differential     Status: None   Collection Time: 01/04/16 12:01 PM  Result Value Ref Range   WBC 7.2 4.0 - 10.5 K/uL   RBC 4.83 4.22 - 5.81 MIL/uL   Hemoglobin 14.7 13.0 - 17.0 g/dL   HCT 43.2 39.0 - 52.0 %   MCV 89.4 78.0 - 100.0 fL   MCH 30.4 26.0 - 34.0 pg    MCHC 34.0 30.0 - 36.0 g/dL   RDW 12.8 11.5 - 15.5 %   Platelets 378 150 - 400 K/uL   Neutrophils Relative % 73 %   Neutro Abs 5.2 1.7 - 7.7 K/uL   Lymphocytes Relative 20 %   Lymphs Abs 1.5 0.7 - 4.0 K/uL   Monocytes Relative 6 %   Monocytes Absolute 0.4 0.1 - 1.0 K/uL   Eosinophils Relative 1 %   Eosinophils Absolute 0.1 0.0 - 0.7 K/uL   Basophils Relative 0 %   Basophils Absolute 0.0 0.0 - 0.1 K/uL  Urinalysis, Routine w reflex microscopic     Status: Abnormal   Collection Time: 01/04/16 12:29 PM  Result Value Ref Range   Color, Urine YELLOW YELLOW   APPearance TURBID (A) CLEAR   Specific Gravity, Urine 1.017 1.005 - 1.030   pH 8.5 (H) 5.0 - 8.0   Glucose, UA NEGATIVE NEGATIVE mg/dL   Hgb urine dipstick SMALL (A) NEGATIVE   Bilirubin Urine NEGATIVE NEGATIVE   Ketones, ur NEGATIVE NEGATIVE mg/dL   Protein, ur 100 (A) NEGATIVE mg/dL   Nitrite POSITIVE (A) NEGATIVE   Leukocytes, UA LARGE (A) NEGATIVE  Urine microscopic-add on     Status: Abnormal   Collection Time: 01/04/16 12:29 PM  Result Value Ref Range   Squamous Epithelial / LPF NONE SEEN NONE SEEN   WBC, UA 6-30 0 - 5 WBC/hpf   RBC / HPF 6-30 0 - 5 RBC/hpf   Bacteria, UA MANY (A) NONE SEEN   Crystals TRIPLE PHOSPHATE CRYSTALS (A) NEGATIVE  Lactic acid, plasma     Status: None   Collection Time: 01/04/16  4:21 PM  Result Value Ref Range   Lactic Acid, Venous 1.2 0.5 - 1.9 mmol/L     RADIOLOGY RESULTS: Ct Renal Stone Study  Result Date: 01/04/2016 CLINICAL DATA:  70 year old male with a history of urinary retention status post suprapubic catheter placement 3 weeks prior, presenting with abdominal pain and decreased urine output in catheter bag. EXAM: CT ABDOMEN AND PELVIS WITHOUT CONTRAST TECHNIQUE: Multidetector CT imaging of the abdomen and pelvis was performed following the standard protocol without IV contrast. COMPARISON:  None. FINDINGS: Lower chest: Centrilobular emphysema is present at the lung bases.  Subpleural 4 mm pulmonary nodule associated with the right major fissure (series 6/ image 20). Nonspecific patchy subpleural reticulation at both lung bases. Hepatobiliary: Normal liver size. Small scattered simple liver cysts measuring up to 1.8 cm in the anterior left liver lobe. Several additional subcentimeter hypodense liver lesions scattered throughout the liver, too small to characterize, which require no further follow-up. Normal gallbladder with no radiopaque cholelithiasis. No biliary ductal dilatation. Pancreas: Low-attenuation 1.1 cm lesion  in the posterior pancreatic tail (series 2/ image 21). No additional pancreatic lesions. No main pancreatic duct dilation. Spleen: Normal size. No mass. Adrenals/Urinary Tract: No discrete adrenal nodule. Bilateral renal atrophy, asymmetrically severe on the right. No right hydronephrosis. Fullness of the central left renal collecting system without overt left hydronephrosis. Hyperdense 0.8 cm renal cortical lesion in the interpolar right kidney. No additional contour deforming renal lesions. No renal stones. Normal caliber ureters, with no ureteral stones. Bladder is completely collapsed by indwelling suprapubic catheter. Expected scattered gas in the bladder lumen from instrumentation. No bladder stones. No definite bladder wall thickening. Stomach/Bowel: Grossly normal stomach. There are small bilateral inguinal hernias, both of which contain portions of pelvic small bowel loops. No small bowel dilatation or full caliber transition. No small bowel wall thickening or pneumatosis. Appendix is not discretely visualized. Normal large bowel with no diverticulosis, large bowel wall thickening or pericolonic fat stranding. Vascular/Lymphatic: Atherosclerotic nonaneurysmal abdominal aorta. No pathologically enlarged lymph nodes in the abdomen or pelvis. Reproductive: Top-normal size prostate with coarse nonspecific internal prostatic calcifications. Other: No  pneumoperitoneum. Diffuse haziness of the mesenteric fat. No focal fluid collections. Musculoskeletal: No aggressive appearing focal osseous lesions. Mild-to-moderate thoracolumbar spondylosis, most prominent at L5-S1. IMPRESSION: 1. Bladder is collapsed by indwelling suprapubic catheter. Fullness of the central left renal collecting system without overt left hydronephrosis. No right hydronephrosis. Bilateral renal atrophy, asymmetrically severe on the right. 2. Small bilateral inguinal hernias containing pelvic small bowel loops. No evidence of small-bowel obstruction or ischemia. 3. Diffuse haziness of the mesenteric fat. No pneumoperitoneum. No focal fluid collections. Findings could indicate an infectious or inflammatory peritonitis. 4. **An incidental finding of potential clinical significance has been found. Low-attenuation 1.1 cm posterior pancreatic tail lesion without pancreatic duct dilation. Recommend follow up pre and post contrast MRI abdomen/MRCP or pancreatic protocol CT in 2 years. This recommendation follows ACR consensus guidelines: Management of Incidental Pancreatic Cysts: A White Paper of the ACR Incidental Findings Committee. [place] 5009;38:182-993. ** 5. Hyperdense 0.8 cm interpolar right renal cortical lesion, too small to characterize, no further workup recommended. This recommendation follows ACR consensus guidelines: Management of the Incidental Renal Mass on CT: A White Paper of the ACR Incidental Findings Committee. J Am Coll Radiol 2017; article in press. 6. Subpleural 4 mm right lung base pulmonary nodule. No follow-up needed if patient is low-risk. Non-contrast chest CT can be considered in 12 months if patient is high-risk. This recommendation follows the consensus statement: Guidelines for Management of Incidental Pulmonary Nodules Detected on CT Images: From the Fleischner Society 2017; Radiology 2017; 284:228-243. 7. Additional findings include centrilobular emphysema  and aortic atherosclerosis. Electronically Signed   By: Ilona Sorrel M.D.   On: 01/04/2016 14:35    Problem List: Patient Active Problem List   Diagnosis Date Noted  . Abdominal pain 01/04/2016  . 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 CKD (chronic kidney disease) stage 3, GFR 30-59 ml/min 01/04/2016  . Nausea & vomiting 01/04/2016  . Pancreatic lesion 01/04/2016    Assessment & Plan: Abdominal pain, nausea and vomiting--gastroenteritis.  Will suggest gallbladder ultrasound.  CCS will follow with you.      Matt B. Hassell Done, MD, [facility] [phone] beeper [phone]  01/04/2016 5:09 PM

## 2016-01-04 NOTE — ED Notes (Signed)
Patient states pain and nausea medications are not doing anything for him. MD aware and awaiting new orders.

## 2016-01-04 NOTE — ED Triage Notes (Signed)
Patient reports getting his suprapubic catheter placed 3 weeks ago due to urinary retention. Patient reports decreased urine output in catheter bag and pain starting this morning.

## 2016-01-04 NOTE — ED Notes (Signed)
ED Provider at bedside. 

## 2016-01-05 DIAGNOSIS — N183 Chronic kidney disease, stage 3 (moderate): Secondary | ICD-10-CM | POA: Diagnosis not present

## 2016-01-05 DIAGNOSIS — R109 Unspecified abdominal pain: Principal | ICD-10-CM

## 2016-01-05 DIAGNOSIS — K869 Disease of pancreas, unspecified: Secondary | ICD-10-CM

## 2016-01-05 DIAGNOSIS — K4020 Bilateral inguinal hernia, without obstruction or gangrene, not specified as recurrent: Secondary | ICD-10-CM | POA: Diagnosis not present

## 2016-01-05 DIAGNOSIS — Z9359 Other cystostomy status: Secondary | ICD-10-CM

## 2016-01-05 DIAGNOSIS — I10 Essential (primary) hypertension: Secondary | ICD-10-CM | POA: Diagnosis not present

## 2016-01-05 DIAGNOSIS — R112 Nausea with vomiting, unspecified: Secondary | ICD-10-CM | POA: Diagnosis not present

## 2016-01-05 LAB — CBC
HCT: 39.9 % (ref 39.0–52.0)
Hemoglobin: 13.6 g/dL (ref 13.0–17.0)
MCH: 30.6 pg (ref 26.0–34.0)
MCHC: 34.1 g/dL (ref 30.0–36.0)
MCV: 89.7 fL (ref 78.0–100.0)
PLATELETS: 332 10*3/uL (ref 150–400)
RBC: 4.45 MIL/uL (ref 4.22–5.81)
RDW: 13 % (ref 11.5–15.5)
WBC: 8 10*3/uL (ref 4.0–10.5)

## 2016-01-05 LAB — BASIC METABOLIC PANEL
Anion gap: 3 — ABNORMAL LOW (ref 5–15)
BUN: 27 mg/dL — AB (ref 6–20)
CALCIUM: 9 mg/dL (ref 8.9–10.3)
CO2: 25 mmol/L (ref 22–32)
CREATININE: 3.04 mg/dL — AB (ref 0.61–1.24)
Chloride: 108 mmol/L (ref 101–111)
GFR calc Af Amer: 23 mL/min — ABNORMAL LOW (ref >60)
GFR calc non Af Amer: 19 mL/min — ABNORMAL LOW (ref >60)
GLUCOSE: 92 mg/dL (ref 65–99)
Potassium: 4.2 mmol/L (ref 3.5–5.1)
Sodium: 136 mmol/L (ref 135–145)

## 2016-01-05 NOTE — Discharge Summary (Signed)
Discharge Summary  [name] [id number] DOB: [birth year]  PCP: No primary care provider on file.  Admit date: 01/04/2016 Discharge date: 01/05/2016  Time spent: <78mins  Recommendations for Outpatient Follow-up:  1. F/u with PMD at [facility] within a week  for hospital discharge follow up, repeat cbc/bmp at follow up Pancreatic lesion 1.1 cm at the tail of pancreas -This is incidental finding on CT scan pmd to arrange MRCP or CT scan in two years.  2. F/u with urology for suprapubic catheter management  Discharge Diagnoses:  Active Hospital Problems   Diagnosis Date Noted  . Abdominal pain 01/04/2016  . Chronic suprapubic catheter ([place]) 01/04/2016  . CKD (chronic kidney disease) stage 3, GFR 30-59 ml/min 01/04/2016  . Nausea & vomiting 01/04/2016  . Pancreatic lesion 01/04/2016    Resolved Hospital Problems   Diagnosis Date Noted Date Resolved  No resolved problems to display.    Discharge Condition: stable  Diet recommendation: heart healthy  Filed Weights   01/04/16 1116  Weight: 74.8 kg (165 lb)    History of present illness:  Chief Complaint: Abdominal pain  HPI: Gerald Thompson is a 70 y.o. male with medical history significant of CKD stage III, dysfunctional bladder status post suprapubic catheter and hypertension came to the hospital complaining about abdominal pain. Patient was in his usual state of health until this morning, he had breakfast at [facility], soon after that he started to have abdominal pain with nausea, he vomited 3 times. Initially he thought his suprapubic catheter clogged, but reported that he continues to have good urine output. Initial evaluation in the hospital CT scan was done showed no acute findings, patient to be observed overnight, still complaining about severe 8 /10 pain.  ED Course:  Vitals: WNL, blood pressure elevated at 160/93 on admission Labs: WNL except creatinine of 3.0 Imaging: CT of abdomen/pelvis showed  no acute findings Interventions: Referred to observation  Hospital Course:  Principal Problem:   Abdominal pain Active Problems:   Chronic suprapubic catheter (H[facility])   CKD (chronic kidney disease) stage 3, GFR 30-59 ml/min   Nausea & vomiting   Pancreatic lesion  Abdominal pain / Nausea and vomiting -Along with abdominal pain, unclear etiology but this could be transient gastroenteritis. -CT scan no findings of gastritis or other acute findings, gallbladder is normal, treat symptomatically. RUQ [place] negative for cholelithiasis or cholecystitis Symptom resolved with conservative management Surgery consulted, no need to surgical intervention, he is cleared to discharge home by surgery.  Bilateral inguinal hernias: Ct showed pelvic small bowels in hernias, but no obstruction of ischemia Patient is instructed to follow up with general surgery of his choice for this  Suprapubic catheter -Since September 2017, follows with Dr. Amalia Hailey at [facility]. -Urine has green hue and urinalysis has many bacteria suggesting colonization, no fever/chills or leukocytosis.   HTN -Restart home medications, slightly elevated, likely secondary to pain.  CKD stage III -Presented with creatinine of 3.0, this is around baseline from records obtained from [place] (check care of you were)  Pancreatic lesion 1.1 cm at the tail of pancreas -This is incidental finding on CT scan, recommended follow-up as outpatient with MRCP or CT scan.   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  Procedures:  none  Consultations:  General surgery  DVT prophylaxis while in the hospital: SQ Heparin Code Status: Full code Family Communication: Plan D/W patient and significant othersedalia Disposition Plan: Home  Discharge Exam: BP Marland Kitchen)  147/90 (BP Location: Right Arm)   Pulse 92   Temp 98.4 F (36.9 C) (Oral)   Resp 18   Ht 6\' 1"  (1.854 m)   Wt 74.8 kg (165 lb)   SpO2 99%   BMI  21.77 kg/m   General: NAd, very hard of hearing, hearing aids runout of battery Cardiovascular: RRR Respiratory: CTABL Abdomen: nontender, +BS, suprapubic catheter in place  Discharge Instructions You were cared for by a hospitalist during your hospital stay. If you have any questions about your discharge medications or the care you received while you were in the hospital after you are discharged, you can call the unit and asked to speak with the hospitalist on call if the hospitalist that took care of you is not available. Once you are discharged, your primary care physician will handle any further medical issues. Please note that NO REFILLS for any discharge medications will be authorized once you are discharged, as it is imperative that you return to your primary care physician (or establish a relationship with a primary care physician if you do not have one) for your aftercare needs so that they can reassess your need for medications and monitor your lab values.  Discharge Instructions    Discharge instructions    Complete by:  As directed    May have soup and soft diet for the next few days, advance diet to regular diet as tolerated   Increase activity slowly    Complete by:  As directed        Medication List    STOP taking these medications   HYDROcodone-acetaminophen 5-325 MG tablet Commonly known as:  NORCO/VICODIN     TAKE these medications   amLODipine 2.5 MG tablet Commonly known as:  NORVASC Take 2.5 mg by mouth.   buPROPion 150 MG 12 hr tablet Commonly known as:  WELLBUTRIN SR Take 150 mg by mouth 2 (two) times daily.   DITROPAN XL 10 MG 24 hr tablet Generic drug:  oxybutynin Take 10 mg by mouth daily.   MULTI-VITAMINS Tabs Take 1 tablet by mouth every morning.   ranitidine 150 MG capsule Commonly known as:  ZANTAC Take 150 mg by mouth daily as needed for heartburn.   senna-docusate 8.6-50 MG tablet Commonly known as:  Senokot-S Take 1 tablet by mouth  daily.      No Known Allergies Follow-up Information    f/u with pcp at [facility] Follow up.   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       f/u with general surgery Follow up.   Why:  for Bilateral inguinal hernias       f/u with urology Dr Hilaria Ota Follow up.            The results of significant diagnostics from this hospitalization (including imaging, microbiology, ancillary and laboratory) are listed below for reference.    Significant Diagnostic Studies: US Abdomen Complete  Result Date: 01/04/2016 CLINICAL DATA:  Abdominal pain and decreased urine output beginning this morning. EXAM: ABDOMEN ULTRASOUND COMPLETE COMPARISON:  Noncontrast CT on 01/04/2016 FINDINGS: Gallbladder: No gallstones or wall thickening visualized. No sonographic Murphy sign noted by sonographer. Common bile duct: Diameter: 6 mm, within normal limits. Liver: Within normal limits in parenchymal echogenicity. Several small 1-2 cm hepatic cysts are noted. No solid liver masses  identified. IVC: No abnormality visualized. Pancreas: Visualized portion unremarkable. Spleen: Size and appearance within normal limits. Right Kidney: Length: 9.3 cm. Limited visualization. Diffuse parenchymal atrophy. No definite mass or hydronephrosis visualized. Left Kidney: Length: 10.8 cm. Mildly increased renal parenchymal echogenicity. No mass or hydronephrosis visualized. Abdominal aorta: No aneurysm visualized. Other findings: None. IMPRESSION: No evidence gallstones or biliary ductal dilatation. Tiny hepatic cysts.  No liver mass visualized. Diffuse right renal parenchymal atrophy and increased left renal parenchymal echogenicity, consistent with medical renal disease. No evidence of hydronephrosis. Electronically Signed   By: Earle Gell M.D.   On: 01/04/2016 21:32   Ct  Renal Stone Study  Result Date: 01/04/2016 CLINICAL DATA:  70 year old male with a history of urinary retention status post suprapubic catheter placement 3 weeks prior, presenting with abdominal pain and decreased urine output in catheter bag. EXAM: CT ABDOMEN AND PELVIS WITHOUT CONTRAST TECHNIQUE: Multidetector CT imaging of the abdomen and pelvis was performed following the standard protocol without IV contrast. COMPARISON:  None. FINDINGS: Lower chest: Centrilobular emphysema is present at the lung bases. Subpleural 4 mm pulmonary nodule associated with the right major fissure (series 6/ image 20). Nonspecific patchy subpleural reticulation at both lung bases. Hepatobiliary: Normal liver size. Small scattered simple liver cysts measuring up to 1.8 cm in the anterior left liver lobe. Several additional subcentimeter hypodense liver lesions scattered throughout the liver, too small to characterize, which require no further follow-up. Normal gallbladder with no radiopaque cholelithiasis. No biliary ductal dilatation. Pancreas: Low-attenuation 1.1 cm lesion in the posterior pancreatic tail (series 2/ image 21). No additional pancreatic lesions. No main pancreatic duct dilation. Spleen: Normal size. No mass. Adrenals/Urinary Tract: No discrete adrenal nodule. Bilateral renal atrophy, asymmetrically severe on the right. No right hydronephrosis. Fullness of the central left renal collecting system without overt left hydronephrosis. Hyperdense 0.8 cm renal cortical lesion in the interpolar right kidney. No additional contour deforming renal lesions. No renal stones. Normal caliber ureters, with no ureteral stones. Bladder is completely collapsed by indwelling suprapubic catheter. Expected scattered gas in the bladder lumen from instrumentation. No bladder stones. No definite bladder wall thickening. Stomach/Bowel: Grossly normal stomach. There are small bilateral inguinal hernias, both of which contain portions of  pelvic small bowel loops. No small bowel dilatation or full caliber transition. No small bowel wall thickening or pneumatosis. Appendix is not discretely visualized. Normal large bowel with no diverticulosis, large bowel wall thickening or pericolonic fat stranding. Vascular/Lymphatic: Atherosclerotic nonaneurysmal abdominal aorta. No pathologically enlarged lymph nodes in the abdomen or pelvis. Reproductive: Top-normal size prostate with coarse nonspecific internal prostatic calcifications. Other: No pneumoperitoneum. Diffuse haziness of the mesenteric fat. No focal fluid collections. Musculoskeletal: No aggressive appearing focal osseous lesions. Mild-to-moderate thoracolumbar spondylosis, most prominent at L5-S1. IMPRESSION: 1. Bladder is collapsed by indwelling suprapubic catheter. Fullness of the central left renal collecting system without overt left hydronephrosis. No right hydronephrosis. Bilateral renal atrophy, asymmetrically severe on the right. 2. Small bilateral inguinal hernias containing pelvic small bowel loops. No evidence of small-bowel obstruction or ischemia. 3. Diffuse haziness of the mesenteric fat. No pneumoperitoneum. No focal fluid collections. Findings could indicate an infectious or inflammatory peritonitis. 4. **An incidental finding of potential clinical significance has been found. Low-attenuation 1.1 cm posterior pancreatic tail lesion without pancreatic duct dilation. Recommend follow up pre and post contrast MRI abdomen/MRCP or pancreatic protocol CT in 2 years. This recommendation follows ACR consensus guidelines: Management of Incidental Pancreatic Cysts: A White Paper of the ACR Incidental Findings  Committee. [place] [id number]. ** 5. Hyperdense 0.8 cm interpolar right renal cortical lesion, too small to characterize, no further workup recommended. This recommendation follows ACR consensus guidelines: Management of the Incidental Renal Mass on CT: A White Paper of  the ACR Incidental Findings Committee. J Am Coll Radiol 2017; article in press. 6. Subpleural 4 mm right lung base pulmonary nodule. No follow-up needed if patient is low-risk. Non-contrast chest CT can be considered in 12 months if patient is high-risk. This recommendation follows the consensus statement: Guidelines for Management of Incidental Pulmonary Nodules Detected on CT Images: From the Fleischner Society 2017; Radiology 2017; 284:228-243. 7. Additional findings include centrilobular emphysema and aortic atherosclerosis. Electronically Signed   By: Ilona Sorrel M.D.   On: 01/04/2016 14:35    Microbiology: No results found for this or any previous visit (from the past 240 hour(s)).   Labs: Basic Metabolic Panel:  Recent Labs Lab 01/04/16 1201 01/05/16 0446  NA 136 136  K 4.2 4.2  CL 101 108  CO2 28 25  GLUCOSE 107* 92  BUN 30* 27*  CREATININE 3.00* 3.04*  CALCIUM 9.4 9.0   Liver Function Tests:  Recent Labs Lab 01/04/16 1201  AST 29  ALT 24  ALKPHOS 67  BILITOT 0.7  PROT 8.1  ALBUMIN 3.7    Recent Labs Lab 01/04/16 1201  LIPASE 28   No results for input(s): AMMONIA in the last 168 hours. CBC:  Recent Labs Lab 01/04/16 1201 01/05/16 0446  WBC 7.2 8.0  NEUTROABS 5.2  --   HGB 14.7 13.6  HCT 43.2 39.9  MCV 89.4 89.7  PLT 378 332   Cardiac Enzymes: No results for input(s): CKTOTAL, CKMB, CKMBINDEX, TROPONINI in the last 168 hours. BNP: BNP (last 3 results) No results for input(s): BNP in the last 8760 hours.  ProBNP (last 3 results) No results for input(s): PROBNP in the last 8760 hours.  CBG: No results for input(s): GLUCAP in the last 168 hours.     SignedFlorencia Reasons MD, PhD  [facility] 01/05/2016, 12:12 PM

## 2016-01-05 NOTE — Progress Notes (Signed)
[facility] Progress Note     Subjective: States " I feel normal". Abdominal pain has resolved after use of mylicon and passing large amounts of flatus last night/this morning. Belching has decreased. Denies any fever, chills, night sweats, nausea/vomiting. C/o hunger.  Objective: Vital signs in last 24 hours: Temp:  [97.6 F (36.4 C)-98.4 F (36.9 C)] 98.4 F (36.9 C) (11/06 0607) Pulse Rate:  [65-98] 92 (11/06 0607) Resp:  [16-18] 18 (11/06 0607) BP: (138-205)/(90-116) 147/90 (11/06 0607) SpO2:  [97 %-100 %] 99 % (11/06 0607) Weight:  [165 lb (74.8 kg)] 165 lb (74.8 kg) (11/05 1116) Last BM Date: 01/04/16  Intake/Output from previous day: 11/05 0701 - 11/06 0700 In: 1653.3 [I.V.:1133.3; IV Piggyback:520] Out: 2100 [Urine:2000; Emesis/NG output:100] Intake/Output this shift: No intake/output data recorded.  PE: Gen:  Alert, NAD, pleasant; hard of hearing  Card:  RRR, no M/G/R  Pulm:  CTA, no W/R/R Abd: firm, NT/ND, +BS, no HSM; no peritonitis   Lab Results:   Recent Labs  01/04/16 1201 01/05/16 0446  WBC 7.2 8.0  HGB 14.7 13.6  HCT 43.2 39.9  PLT 378 332   BMET  Recent Labs  01/04/16 1201 01/05/16 0446  NA 136 136  K 4.2 4.2  CL 101 108  CO2 28 25  GLUCOSE 107* 92  BUN 30* 27*  CREATININE 3.00* 3.04*  CALCIUM 9.4 9.0   PT/INR  Recent Labs  01/04/16 1743  LABPROT 15.2  INR 1.19   CMP     Component Value Date/Time   NA 136 01/05/2016 0446   K 4.2 01/05/2016 0446   CL 108 01/05/2016 0446   CO2 25 01/05/2016 0446   GLUCOSE 92 01/05/2016 0446   BUN 27 (H) 01/05/2016 0446   CREATININE 3.04 (H) 01/05/2016 0446   CALCIUM 9.0 01/05/2016 0446   PROT 8.1 01/04/2016 1201   ALBUMIN 3.7 01/04/2016 1201   AST 29 01/04/2016 1201   ALT 24 01/04/2016 1201   ALKPHOS 67 01/04/2016 1201   BILITOT 0.7 01/04/2016 1201   GFRNONAA 19 (L) 01/05/2016 0446   GFRAA 23 (L) 01/05/2016 0446   Lipase     Component Value Date/Time   LIPASE 28  01/04/2016 1201       Studies/Results: US Abdomen Complete  Result Date: 01/04/2016 CLINICAL DATA:  Abdominal pain and decreased urine output beginning this morning. EXAM: ABDOMEN ULTRASOUND COMPLETE COMPARISON:  Noncontrast CT on 01/04/2016 FINDINGS: Gallbladder: No gallstones or wall thickening visualized. No sonographic Murphy sign noted by sonographer. Common bile duct: Diameter: 6 mm, within normal limits. Liver: Within normal limits in parenchymal echogenicity. Several small 1-2 cm hepatic cysts are noted. No solid liver masses identified. IVC: No abnormality visualized. Pancreas: Visualized portion unremarkable. Spleen: Size and appearance within normal limits. Right Kidney: Length: 9.3 cm. Limited visualization. Diffuse parenchymal atrophy. No definite mass or hydronephrosis visualized. Left Kidney: Length: 10.8 cm. Mildly increased renal parenchymal echogenicity. No mass or hydronephrosis visualized. Abdominal aorta: No aneurysm visualized. Other findings: None. IMPRESSION: No evidence gallstones or biliary ductal dilatation. Tiny hepatic cysts.  No liver mass visualized. Diffuse right renal parenchymal atrophy and increased left renal parenchymal echogenicity, consistent with medical renal disease. No evidence of hydronephrosis. Electronically Signed   By: Earle Gell M.D.   On: 01/04/2016 21:32   Ct Renal Stone Study  Result Date: 01/04/2016 CLINICAL DATA:  70 year old male with a history of urinary retention status post suprapubic catheter placement 3 weeks prior, presenting with abdominal pain and decreased  urine output in catheter bag. EXAM: CT ABDOMEN AND PELVIS WITHOUT CONTRAST TECHNIQUE: Multidetector CT imaging of the abdomen and pelvis was performed following the standard protocol without IV contrast. COMPARISON:  None. FINDINGS: Lower chest: Centrilobular emphysema is present at the lung bases. Subpleural 4 mm pulmonary nodule associated with the right major fissure (series 6/ image  20). Nonspecific patchy subpleural reticulation at both lung bases. Hepatobiliary: Normal liver size. Small scattered simple liver cysts measuring up to 1.8 cm in the anterior left liver lobe. Several additional subcentimeter hypodense liver lesions scattered throughout the liver, too small to characterize, which require no further follow-up. Normal gallbladder with no radiopaque cholelithiasis. No biliary ductal dilatation. Pancreas: Low-attenuation 1.1 cm lesion in the posterior pancreatic tail (series 2/ image 21). No additional pancreatic lesions. No main pancreatic duct dilation. Spleen: Normal size. No mass. Adrenals/Urinary Tract: No discrete adrenal nodule. Bilateral renal atrophy, asymmetrically severe on the right. No right hydronephrosis. Fullness of the central left renal collecting system without overt left hydronephrosis. Hyperdense 0.8 cm renal cortical lesion in the interpolar right kidney. No additional contour deforming renal lesions. No renal stones. Normal caliber ureters, with no ureteral stones. Bladder is completely collapsed by indwelling suprapubic catheter. Expected scattered gas in the bladder lumen from instrumentation. No bladder stones. No definite bladder wall thickening. Stomach/Bowel: Grossly normal stomach. There are small bilateral inguinal hernias, both of which contain portions of pelvic small bowel loops. No small bowel dilatation or full caliber transition. No small bowel wall thickening or pneumatosis. Appendix is not discretely visualized. Normal large bowel with no diverticulosis, large bowel wall thickening or pericolonic fat stranding. Vascular/Lymphatic: Atherosclerotic nonaneurysmal abdominal aorta. No pathologically enlarged lymph nodes in the abdomen or pelvis. Reproductive: Top-normal size prostate with coarse nonspecific internal prostatic calcifications. Other: No pneumoperitoneum. Diffuse haziness of the mesenteric fat. No focal fluid collections. Musculoskeletal:  No aggressive appearing focal osseous lesions. Mild-to-moderate thoracolumbar spondylosis, most prominent at L5-S1. IMPRESSION: 1. Bladder is collapsed by indwelling suprapubic catheter. Fullness of the central left renal collecting system without overt left hydronephrosis. No right hydronephrosis. Bilateral renal atrophy, asymmetrically severe on the right. 2. Small bilateral inguinal hernias containing pelvic small bowel loops. No evidence of small-bowel obstruction or ischemia. 3. Diffuse haziness of the mesenteric fat. No pneumoperitoneum. No focal fluid collections. Findings could indicate an infectious or inflammatory peritonitis. 4. **An incidental finding of potential clinical significance has been found. Low-attenuation 1.1 cm posterior pancreatic tail lesion without pancreatic duct dilation. Recommend follow up pre and post contrast MRI abdomen/MRCP or pancreatic protocol CT in 2 years. This recommendation follows ACR consensus guidelines: Management of Incidental Pancreatic Cysts: A White Paper of the ACR Incidental Findings Committee. [place] [id number]. ** 5. Hyperdense 0.8 cm interpolar right renal cortical lesion, too small to characterize, no further workup recommended. This recommendation follows ACR consensus guidelines: Management of the Incidental Renal Mass on CT: A White Paper of the ACR Incidental Findings Committee. J Am Coll Radiol 2017; article in press. 6. Subpleural 4 mm right lung base pulmonary nodule. No follow-up needed if patient is low-risk. Non-contrast chest CT can be considered in 12 months if patient is high-risk. This recommendation follows the consensus statement: Guidelines for Management of Incidental Pulmonary Nodules Detected on CT Images: From the Fleischner Society 2017; Radiology 2017; 284:228-243. 7. Additional findings include centrilobular emphysema and aortic atherosclerosis. Electronically Signed   By: Ilona Sorrel M.D.   On: 01/04/2016 14:35     Anti-infectives: Anti-infectives  None     Assessment/Plan Abdominal pain Nausea/vomiting   RUQ U/S negative for cholelithiasis/cholecystitis   No leukocytosis  Suspect transient gastroenteritis - supportive care; IVF, Levbid, Pepcid  Bilateral inguinal hernias   Contain small bowel  No signs/sxs of obstruction or ischemia   CKD III - SCr 3.0 Dysfunctional bladder s/p suprapubic catheter HTN - uncontrolled, per medicine   FEN: clear liquid diet ID: none VTE: Helper heparin, SCD's   Plan: pain and vomiting resolved. Labs are still WNL with exception of baseline kidney dysfunction. no acute surgical needs. Stable for discharge from a surgical standpoint.    LOS: 0 days    Jill Alexanders , [facility] 01/05/2016, 8:20 AM Pager: [phone] Consults: [phone] Mon-Fri 7:00 am-4:30 pm Sat-Sun 7:00 am-11:30 am

## 2016-01-05 NOTE — Progress Notes (Signed)
Discharge instructions given to patient. Questions answered 

## 2016-01-06 LAB — URINE CULTURE

## 2016-01-06 LAB — HEMOGLOBIN A1C
Hgb A1c MFr Bld: 5.6 % (ref 4.8–5.6)
Mean Plasma Glucose: 114 mg/dL

## 2016-01-10 ENCOUNTER — Encounter (HOSPITAL_COMMUNITY): Payer: Self-pay

## 2016-01-10 ENCOUNTER — Emergency Department (HOSPITAL_COMMUNITY)
Admission: EM | Admit: 2016-01-10 | Discharge: 2016-01-10 | Disposition: A | Discharge status: Home / Self Care | Payer: BLUE CROSS/BLUE SHIELD | Attending: Emergency Medicine | Admitting: Emergency Medicine

## 2016-01-10 DIAGNOSIS — I129 Hypertensive chronic kidney disease with stage 1 through stage 4 chronic kidney disease, or unspecified chronic kidney disease: Secondary | ICD-10-CM | POA: Insufficient documentation

## 2016-01-10 DIAGNOSIS — Z87891 Personal history of nicotine dependence: Secondary | ICD-10-CM | POA: Insufficient documentation

## 2016-01-10 DIAGNOSIS — T83098A Other mechanical complication of other indwelling urethral catheter, initial encounter: Secondary | ICD-10-CM | POA: Diagnosis not present

## 2016-01-10 DIAGNOSIS — Y733 Surgical instruments, materials and gastroenterology and urology devices (including sutures) associated with adverse incidents: Secondary | ICD-10-CM | POA: Diagnosis not present

## 2016-01-10 DIAGNOSIS — N183 Chronic kidney disease, stage 3 (moderate): Secondary | ICD-10-CM | POA: Diagnosis not present

## 2016-01-10 DIAGNOSIS — T83091A Other mechanical complication of indwelling urethral catheter, initial encounter: Secondary | ICD-10-CM

## 2016-01-10 DIAGNOSIS — R339 Retention of urine, unspecified: Secondary | ICD-10-CM | POA: Diagnosis present

## 2016-01-10 LAB — URINALYSIS, ROUTINE W REFLEX MICROSCOPIC
BILIRUBIN URINE: NEGATIVE
GLUCOSE, UA: NEGATIVE mg/dL
KETONES UR: NEGATIVE mg/dL
NITRITE: POSITIVE — AB
PH: 8 (ref 5.0–8.0)
Protein, ur: 100 mg/dL — AB
Specific Gravity, Urine: 1.012 (ref 1.005–1.030)

## 2016-01-10 LAB — URINE MICROSCOPIC-ADD ON

## 2016-01-10 MED ORDER — LEVOFLOXACIN 750 MG PO TABS: 750.0000 mg | ORAL_TABLET | Freq: Every day | ORAL | 0 refills | Status: DC

## 2016-01-10 NOTE — Discharge Instructions (Signed)
Follow-up with your urologist this week, and return to the ER if symptoms significantly worsen or change.

## 2016-01-10 NOTE — ED Triage Notes (Signed)
He reports his suprapubic foley is not draining and he is having "bad bladder pain".  He is in no distress.

## 2016-01-10 NOTE — ED Provider Notes (Signed)
[facility] DEPT Provider Note   CSN: [id number] Arrival date & time: 01/10/16  1606     History   Chief Complaint Chief Complaint  Patient presents with  . Urinary Retention    Cath Problem    HPI Gerald Thompson is a 70 y.o. male.  Patient is a 70 year old male with past medical history of hypertension and urinary retention. He presents for evaluation of Foley catheter malfunction. He has a suprapubic catheter that was placed by his urologist at [facility]. He presents today with complaints of bladder fullness and minimal urine output since this morning. He denies any fevers or chills. He does report pressure in his bladder.   The history is provided by the patient.    Past Medical History:  Diagnosis Date  . Hypertension     Patient Active Problem List   Diagnosis Date Noted  . Abdominal pain 01/04/2016  . 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 CKD (chronic kidney disease) stage 3, GFR 30-59 ml/min 01/04/2016  . Nausea & vomiting 01/04/2016  . Pancreatic lesion 01/04/2016    No past surgical history on file.     Home Medications    Prior to Admission medications   Medication Sig Start Date End Date Taking? Authorizing Provider  amLODipine (NORVASC) 2.5 MG tablet Take 2.5 mg by mouth.    Historical Provider, MD  buPROPion (WELLBUTRIN SR) 150 MG 12 hr tablet Take 150 mg by mouth 2 (two) times daily.    Historical Provider, MD  Multiple Vitamin (MULTI-VITAMINS) TABS Take 1 tablet by mouth every morning.    Historical Provider, MD  oxybutynin (DITROPAN XL) 10 MG 24 hr tablet Take 10 mg by mouth daily.  04/21/15   Historical Provider, MD  ranitidine (ZANTAC) 150 MG capsule Take 150 mg by mouth daily as needed for heartburn.     Historical Provider, MD  senna-docusate (SENOKOT-S) 8.6-50 MG tablet Take 1 tablet by mouth daily. 11/14/15   Historical Provider, MD    Family History No family history on file.  Social History Social History  Substance Use  Topics  . Smoking status: Former Research scientist (life sciences)  . Smokeless tobacco: Never Used  . Alcohol use No     Allergies   Patient has no known allergies.   Review of Systems Review of Systems  All other systems reviewed and are negative.    Physical Exam Updated Vital Signs There were no vitals taken for this visit.  Physical Exam  Constitutional: He is oriented to person, place, and time. He appears well-developed and well-nourished. No distress.  HENT:  Head: Normocephalic and atraumatic.  Neck: Normal range of motion. Neck supple.  Neurological: He is alert and oriented to person, place, and time.  Skin: Skin is warm and dry. He is not diaphoretic.  Nursing note and vitals reviewed.    ED Treatments / Results  Labs (all labs ordered are listed, but only abnormal results are displayed) Labs Reviewed - No data to display  EKG  EKG Interpretation None       Radiology No results found.  Procedures Procedures (including critical care time)  Medications Ordered in ED Medications - No data to display   Initial Impression / Assessment and Plan / ED Course  I have reviewed the triage vital signs and the nursing notes.  Pertinent labs & imaging results that were available during my care of the patient were reviewed by me and considered in my medical decision making (see chart for details).  Clinical  Course    Foley catheter procedure: The area was prepped with Betadine and sterile towels were placed. Sterile gloves were used and the prior catheter was removed. Urine immediately began to flow from the suprapubic catheter site. A 20 French catheter was then replaced and drained well. The balloon was inflated to 10 mL.  The Foley catheter was unable to be irrigated to restore flow. The catheter was removed and a new one was placed by myself with drainage of 500 mL. Urinalysis suggestive of a UTI. The urine is cloudy. He will be treated with Levaquin and follow-up with his  urologist.  Final Clinical Impressions(s) / ED Diagnoses   Final diagnoses:  None    New Prescriptions New Prescriptions   No medications on file     Veryl Speak, MD 01/10/16 1740

## 2016-01-10 NOTE — ED Triage Notes (Signed)
Pt presents with severe suprapubic pain secondary to a catheter problem bladder scan of 472ml.

## 2016-01-20 DIAGNOSIS — R339 Retention of urine, unspecified: Secondary | ICD-10-CM | POA: Diagnosis not present

## 2016-01-21 ENCOUNTER — Emergency Department (HOSPITAL_COMMUNITY)
Admission: EM | Admit: 2016-01-21 | Discharge: 2016-01-21 | Disposition: A | Discharge status: Home / Self Care | Payer: BLUE CROSS/BLUE SHIELD | Attending: Emergency Medicine | Admitting: Emergency Medicine

## 2016-01-21 ENCOUNTER — Encounter (HOSPITAL_COMMUNITY): Payer: Self-pay | Admitting: Emergency Medicine

## 2016-01-21 DIAGNOSIS — Z79899 Other long term (current) drug therapy: Secondary | ICD-10-CM | POA: Insufficient documentation

## 2016-01-21 DIAGNOSIS — Y658 Other specified misadventures during surgical and medical care: Secondary | ICD-10-CM | POA: Insufficient documentation

## 2016-01-21 DIAGNOSIS — R339 Retention of urine, unspecified: Secondary | ICD-10-CM | POA: Diagnosis not present

## 2016-01-21 DIAGNOSIS — N183 Chronic kidney disease, stage 3 (moderate): Secondary | ICD-10-CM | POA: Diagnosis not present

## 2016-01-21 DIAGNOSIS — I129 Hypertensive chronic kidney disease with stage 1 through stage 4 chronic kidney disease, or unspecified chronic kidney disease: Secondary | ICD-10-CM | POA: Insufficient documentation

## 2016-01-21 DIAGNOSIS — T839XXD Unspecified complication of genitourinary prosthetic device, implant and graft, subsequent encounter: Secondary | ICD-10-CM

## 2016-01-21 DIAGNOSIS — Z87891 Personal history of nicotine dependence: Secondary | ICD-10-CM | POA: Insufficient documentation

## 2016-01-21 DIAGNOSIS — T83091A Other mechanical complication of indwelling urethral catheter, initial encounter: Secondary | ICD-10-CM | POA: Diagnosis not present

## 2016-01-21 LAB — URINALYSIS, ROUTINE W REFLEX MICROSCOPIC
BILIRUBIN URINE: NEGATIVE
Glucose, UA: NEGATIVE mg/dL
KETONES UR: NEGATIVE mg/dL
Nitrite: NEGATIVE
PROTEIN: 100 mg/dL — AB
Specific Gravity, Urine: 1.013 (ref 1.005–1.030)
pH: 6.5 (ref 5.0–8.0)

## 2016-01-21 LAB — URINE MICROSCOPIC-ADD ON

## 2016-01-21 NOTE — ED Notes (Signed)
Dorothea Ogle, our P.A. Is replacing his suprapubic cath. With sterile technique as I write this.

## 2016-01-21 NOTE — ED Notes (Signed)
Bed: WA03 Expected date:  Expected time:  Means of arrival:  Comments: EVS

## 2016-01-21 NOTE — Discharge Instructions (Signed)
Please follow-up with your urologist this week. We'll call if your urine culture results are positive for infection. Return to the ED if he develops worsening symptoms or if you develop further urinary retention or if you develop fevers.

## 2016-01-21 NOTE — ED Provider Notes (Signed)
[facility] DEPT Provider Note   CSN: [id number] Arrival date & time: 01/21/16  1117     History   Chief Complaint Chief Complaint  Patient presents with  . Urinary Retention    HPI Gerald Thompson is a 70 y.o. male.  Gerald Thompson is a 70 y.o. male with medical history significant of CKD stage III, dysfunctional bladder status post suprapubic catheter and hypertension presents to the ED today with urinary retention. Patient states that he woke up this morning and noticed that his supra pubic catheter was not draining. He developed severe suprapubic abdominal pain and states "his bladder feels full and he has to urinate". Patient was seen by his urologist yesterday with no issues. Patient has been seen several times in the ER for the past for the same. Patient was treated on 11/11 and ED for urinary retention and UTI. He was started on Levaquin and patient states that he finished a few days ago. He denies any fever or chills. States the pain feels like a pressure in his bladder. Pain was acute in onset. Nothing makes better or worse. He has not tried anything for the pain at home. Patient denies any other complaints at this time.        Past Medical History:  Diagnosis Date  . Hypertension     Patient Active Problem List   Diagnosis Date Noted  . Abdominal pain 01/04/2016  . 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 CKD (chronic kidney disease) stage 3, GFR 30-59 ml/min 01/04/2016  . Nausea & vomiting 01/04/2016  . Pancreatic lesion 01/04/2016    History reviewed. No pertinent surgical history.     Home Medications    Prior to Admission medications   Medication Sig Start Date End Date Taking? Authorizing Provider  amLODipine (NORVASC) 2.5 MG tablet Take 2.5 mg by mouth.   Yes Historical Provider, MD  buPROPion (WELLBUTRIN SR) 150 MG 12 hr tablet Take 150 mg by mouth 2 (two) times daily.   Yes Historical Provider, MD  Multiple Vitamin (MULTI-VITAMINS)  TABS Take 1 tablet by mouth every morning.   Yes Historical Provider, MD  oxybutynin (DITROPAN XL) 10 MG 24 hr tablet Take 10 mg by mouth daily.  04/21/15  Yes Historical Provider, MD  ranitidine (ZANTAC) 150 MG capsule Take 150 mg by mouth daily as needed for heartburn.    Yes Historical Provider, MD  levofloxacin (LEVAQUIN) 750 MG tablet Take 1 tablet (750 mg total) by mouth daily. X 7 days Patient not taking: Reported on 01/21/2016 01/10/16   Veryl Speak, MD    Family History No family history on file.  Social History Social History  Substance Use Topics  . Smoking status: Former Research scientist (life sciences)  . Smokeless tobacco: Never Used  . Alcohol use No     Allergies   Patient has no known allergies.   Review of Systems Review of Systems  Constitutional: Negative for chills and fever.  HENT: Negative for congestion.   Eyes: Negative for visual disturbance.  Respiratory: Negative for cough and shortness of breath.   Cardiovascular: Negative for chest pain.  Gastrointestinal: Positive for abdominal pain (suprapubic). Negative for diarrhea, nausea and vomiting.  Genitourinary: Positive for decreased urine volume. Negative for dysuria, flank pain, frequency, hematuria and urgency.  Musculoskeletal: Negative.   Skin: Negative.   Neurological: Negative for dizziness, weakness, light-headedness, numbness and headaches.  All other systems reviewed and are negative.    Physical Exam Updated Vital Signs BP (!) 164/109 (BP  Location: Left Arm)   Pulse 89   Temp 98.2 F (36.8 C) (Oral)   Resp 16   Ht 6\' 1"  (1.854 m)   Wt 74.8 kg   SpO2 100%   BMI 21.77 kg/m   Physical Exam  Constitutional: He appears well-developed and well-nourished. No distress.  HENT:  Head: Normocephalic and atraumatic.  Mouth/Throat: Oropharynx is clear and moist.  Eyes: Conjunctivae are normal. Pupils are equal, round, and reactive to light. Right eye exhibits no discharge. Left eye exhibits no discharge. No  scleral icterus.  Neck: Normal range of motion. Neck supple. No thyromegaly present.  Cardiovascular: Normal rate, regular rhythm, normal heart sounds and intact distal pulses.  Exam reveals no gallop and no friction rub.   No murmur heard. Pulmonary/Chest: Effort normal and breath sounds normal. No respiratory distress.  Abdominal: Soft. Bowel sounds are normal. He exhibits no distension. There is tenderness in the suprapubic area. There is no rigidity, no rebound, no guarding and no CVA tenderness.  Musculoskeletal: Normal range of motion.  Lymphadenopathy:    He has no cervical adenopathy.  Neurological: He is alert.  Skin: Skin is warm and dry. Capillary refill takes less than 2 seconds.  Nursing note and vitals reviewed.    ED Treatments / Results  Labs (all labs ordered are listed, but only abnormal results are displayed) Labs Reviewed  URINALYSIS, ROUTINE W REFLEX MICROSCOPIC (NOT AT [facility]) - Abnormal; Notable for the following:       Result Value   APPearance TURBID (*)    Hgb urine dipstick LARGE (*)    Protein, ur 100 (*)    Leukocytes, UA LARGE (*)    All other components within normal limits  URINE MICROSCOPIC-ADD ON - Abnormal; Notable for the following:    Squamous Epithelial / LPF 0-5 (*)    Bacteria, UA FEW (*)    All other components within normal limits  URINE CULTURE    EKG  EKG Interpretation None       Radiology No results found.  Procedures Procedures (including critical care time)  Suprapubic catheter was replaced with a sterile fashion by this provider. Patient was straight in a sterile fashion and cleaned with Betadine. Existing 20 [place] Foley was removed. 61 [place] Foley was replaced with significant urine returned and a 10 mL balloon was inflated with sterile water. Patient tolerated procedure well without any complications. Felt significant relief after replacement of Foley.  Medications Ordered in ED Medications - No data to  display   Initial Impression / Assessment and Plan / ED Course  I have reviewed the triage vital signs and the nursing notes.  Pertinent labs & imaging results that were available during my care of the patient were reviewed by me and considered in my medical decision making (see chart for details).  Clinical Course   Patient presents to ED with decreased urine output in his suprapubic catheter bag. Patient with significant amount of suprapubic tenderness and pressure the patient states related to his bladder being full. Bladder scan showed 500 mL of urine. The catheter was attempted to be irrigated with 20 mL of fluid without any return. A 20 French catheter was replaced by this provider with sterile technique. On removal of the existing catheter significant amount of urine was released. Foley was inserted with drainage into the leg pain. Patient had significant relief of bladder distention and urgency. UA was obtained that showed hemoglobin, protein, leukocytes, few bacteria. Looks like the patient's UTI is  improving however I will culture the urine. Patient just finished dose of Levaquin for UTI. I have instructed patient to follow-up with his urologist concerning the frequent urinary retention and UTIs. I have also encouraged patient to follow up with primary care doctor concerning the patient's elevated blood pressure and protein in the urine. On past review patient blood pressure has been elevated in the ED on each visit. Patient denies any headache or vision changes, chest pain or shortness of breath. Encourage close follow-up with the primary care doctor. Patient is agreeable to the above plan. Pt is hemodynamically stable, in NAD, & able to ambulate in the ED. Pain has been managed & has no complaints prior to dc. Pt is comfortable with above plan and is stable for discharge at this time. All questions were answered prior to disposition. Strict return precautions for f/u to the ED were discussed.  Patient was seen and examined by Dr. Lita Mains who agrees with the above plan.    Final Clinical Impressions(s) / ED Diagnoses   Final diagnoses:  Urinary retention  Problem with Foley catheter, subsequent encounter    New Prescriptions New Prescriptions   No medications on file     Doristine Devoid, PA-C 01/21/16 1413    Julianne Rice, MD 01/23/16 [phone]

## 2016-01-21 NOTE — ED Triage Notes (Signed)
He states his foley catheter "is blocked and I'm in pain".

## 2016-01-21 NOTE — ED Notes (Signed)
Attempted to flush urinary catheter, no success.  PA states catheter needs to be replaced.

## 2016-01-24 LAB — URINE CULTURE: Culture: 50000 — AB

## 2016-01-25 ENCOUNTER — Telehealth (HOSPITAL_BASED_OUTPATIENT_CLINIC_OR_DEPARTMENT_OTHER): Payer: Self-pay

## 2016-01-25 NOTE — Telephone Encounter (Signed)
No treatment for UC per Corey Ball Pharm D 

## 2016-02-10 DIAGNOSIS — Z9359 Other cystostomy status: Secondary | ICD-10-CM | POA: Diagnosis not present

## 2016-02-13 ENCOUNTER — Other Ambulatory Visit: Payer: Self-pay | Admitting: Surgery

## 2016-02-13 DIAGNOSIS — K4020 Bilateral inguinal hernia, without obstruction or gangrene, not specified as recurrent: Secondary | ICD-10-CM | POA: Diagnosis not present

## 2016-02-23 ENCOUNTER — Emergency Department (HOSPITAL_COMMUNITY)
Admission: EM | Admit: 2016-02-23 | Discharge: 2016-02-23 | Disposition: A | Discharge status: Home / Self Care | Payer: BLUE CROSS/BLUE SHIELD | Attending: Emergency Medicine | Admitting: Emergency Medicine

## 2016-02-23 ENCOUNTER — Encounter (HOSPITAL_COMMUNITY): Payer: Self-pay | Admitting: Emergency Medicine

## 2016-02-23 DIAGNOSIS — Z87891 Personal history of nicotine dependence: Secondary | ICD-10-CM | POA: Insufficient documentation

## 2016-02-23 DIAGNOSIS — Y658 Other specified misadventures during surgical and medical care: Secondary | ICD-10-CM | POA: Diagnosis not present

## 2016-02-23 DIAGNOSIS — I129 Hypertensive chronic kidney disease with stage 1 through stage 4 chronic kidney disease, or unspecified chronic kidney disease: Secondary | ICD-10-CM | POA: Insufficient documentation

## 2016-02-23 DIAGNOSIS — T83090A Other mechanical complication of cystostomy catheter, initial encounter: Secondary | ICD-10-CM

## 2016-02-23 DIAGNOSIS — N183 Chronic kidney disease, stage 3 (moderate): Secondary | ICD-10-CM | POA: Insufficient documentation

## 2016-02-23 DIAGNOSIS — T83098A Other mechanical complication of other indwelling urethral catheter, initial encounter: Secondary | ICD-10-CM | POA: Diagnosis not present

## 2016-02-23 NOTE — ED Notes (Signed)
Patient catheter readjusted and urine is flowing freely.

## 2016-02-23 NOTE — Discharge Instructions (Signed)
1. Medications: usual home medications 2. Treatment: rest, drink plenty of fluids,  3. Follow Up: Please followup with Dr. Amalia Hailey by calling the clinic tomorrow.  Please return to the ER for for persistent abdominal pain, fevers, chills, discoloration of urine, additional problems with your catheter or other concerns.

## 2016-02-23 NOTE — ED Notes (Signed)
Bed: WA08 Expected date:  Expected time:  Means of arrival:  Comments: 

## 2016-02-23 NOTE — ED Triage Notes (Signed)
Patient states that he woke up in pain. Catheter is not putting out any urine. Patient states that it was working before he went to bed.

## 2016-02-23 NOTE — ED Provider Notes (Signed)
[facility] DEPT Provider Note   CSN: [id number] Arrival date & time: 02/23/16  0421     History   Chief Complaint Chief Complaint  Patient presents with  . Suprapubic not draining  . Urinary Retention    HPI Gerald Thompson is a 70 y.o. male with a hx of HTN, presents to the Emergency Department complaining of Acute onset and persistent suprapubic abdominal pain upon waking from sleep one hour prior to arrival.   Patient reports he has a suprapubic catheter in place. He is followed by Dr. Amalia Hailey of urology. He states when he woke up he relies the catheter was not putting out any urine. He reports he was working correctly before he went to bed. He attempted to flush the catheter without success prior to arrival. No other treatments attempted. Nothing makes the symptoms worse. He denies fever, chills, nausea, vomiting, dysuria, hematuria, darkened urine or malodorous urine. He reports an increase in sediment in the last several weeks but no cloudiness of the urine.   The history is provided by the patient and medical records. No language interpreter was used.    Past Medical History:  Diagnosis Date  . Hypertension     Patient Active Problem List   Diagnosis Date Noted  . Abdominal pain 01/04/2016  . 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 CKD (chronic kidney disease) stage 3, GFR 30-59 ml/min 01/04/2016  . Nausea & vomiting 01/04/2016  . Pancreatic lesion 01/04/2016    History reviewed. No pertinent surgical history.     Home Medications    Prior to Admission medications   Medication Sig Start Date End Date Taking? Authorizing Provider  amLODipine (NORVASC) 2.5 MG tablet Take 2.5 mg by mouth.    Historical Provider, MD  buPROPion (WELLBUTRIN SR) 150 MG 12 hr tablet Take 150 mg by mouth 2 (two) times daily.    Historical Provider, MD  levofloxacin (LEVAQUIN) 750 MG tablet Take 1 tablet (750 mg total) by mouth daily. X 7 days Patient not taking: Reported on  01/21/2016 01/10/16   Veryl Speak, MD  Multiple Vitamin (MULTI-VITAMINS) TABS Take 1 tablet by mouth every morning.    Historical Provider, MD  oxybutynin (DITROPAN XL) 10 MG 24 hr tablet Take 10 mg by mouth daily.  04/21/15   Historical Provider, MD  ranitidine (ZANTAC) 150 MG capsule Take 150 mg by mouth daily as needed for heartburn.     Historical Provider, MD    Family History History reviewed. No pertinent family history.  Social History Social History  Substance Use Topics  . Smoking status: Former Research scientist (life sciences)  . Smokeless tobacco: Never Used  . Alcohol use No     Allergies   Patient has no known allergies.   Review of Systems Review of Systems  Gastrointestinal: Positive for abdominal pain.  Genitourinary: Positive for difficulty urinating.  All other systems reviewed and are negative.    Physical Exam Updated Vital Signs BP (!) 205/148 (BP Location: Left Arm)   Pulse 101   Temp 97.8 F (36.6 C) (Oral)   Resp 20   Ht 6\' 1"  (1.854 m)   Wt 74.8 kg   SpO2 100%   BMI 21.77 kg/m   Physical Exam  Constitutional: He appears well-developed and well-nourished. No distress.  Awake, alert, nontoxic appearance  HENT:  Head: Normocephalic and atraumatic.  Mouth/Throat: Oropharynx is clear and moist. No oropharyngeal exudate.  Eyes: Conjunctivae are normal. No scleral icterus.  Neck: Normal range of motion. Neck supple.  Cardiovascular: Normal rate, regular rhythm and intact distal pulses.   Pulmonary/Chest: Effort normal and breath sounds normal. No respiratory distress. He has no wheezes.  Equal chest expansion  Abdominal: Soft. He exhibits no mass. There is no tenderness. There is no rebound and no guarding.    Musculoskeletal: Normal range of motion. He exhibits no edema.  Neurological: He is alert.  Speech is clear and goal oriented Moves extremities without ataxia  Skin: Skin is warm and dry. He is not diaphoretic.  Psychiatric: He has a normal mood and  affect.  Nursing note and vitals reviewed.    ED Treatments / Results   Procedures Procedures (including critical care time)  Medications Ordered in ED Medications - No data to display   Initial Impression / Assessment and Plan / ED Course  I have reviewed the triage vital signs and the nursing notes.  Pertinent labs & imaging results that were available during my care of the patient were reviewed by me and considered in my medical decision making (see chart for details).  Clinical Course   Patient presents with occlusion of his suprapubic catheter. Unable to be flushed at home were initially by triage RN. Bulb was deflated and catheter was reseated with immediate flow of urine.   Catheter then flushed easily. Patient put out approximately 300 mL immediately. Since that time he has put out approximately 50 ML's [place]. He reports complete and total resolution of his pain. He reports he will follow-up with Dr. Amalia Hailey tomorrow for further evaluation of sediment.    Final Clinical Impressions(s) / ED Diagnoses   Final diagnoses:  Obstructed suprapubic catheter, initial encounter [facility])    New Prescriptions New Prescriptions   No medications on file     Abigail Butts, PA-C [phone]    Delora Fuel, MD [phone]

## 2016-02-23 NOTE — ED Notes (Signed)
Bed: WA07 Expected date:  Expected time:  Means of arrival:  Comments: 

## 2016-02-23 NOTE — ED Notes (Signed)
Bed: WHALA Expected date:  Expected time:  Means of arrival:  Comments: 

## 2016-03-01 ENCOUNTER — Emergency Department (HOSPITAL_COMMUNITY)
Admission: EM | Admit: 2016-03-01 | Discharge: 2016-03-01 | Disposition: A | Discharge status: Home / Self Care | Payer: BLUE CROSS/BLUE SHIELD | Attending: Emergency Medicine | Admitting: Emergency Medicine

## 2016-03-01 ENCOUNTER — Encounter (HOSPITAL_COMMUNITY): Payer: Self-pay | Admitting: Family Medicine

## 2016-03-01 DIAGNOSIS — Z87891 Personal history of nicotine dependence: Secondary | ICD-10-CM | POA: Diagnosis not present

## 2016-03-01 DIAGNOSIS — I129 Hypertensive chronic kidney disease with stage 1 through stage 4 chronic kidney disease, or unspecified chronic kidney disease: Secondary | ICD-10-CM | POA: Insufficient documentation

## 2016-03-01 DIAGNOSIS — N183 Chronic kidney disease, stage 3 (moderate): Secondary | ICD-10-CM | POA: Diagnosis not present

## 2016-03-01 DIAGNOSIS — R339 Retention of urine, unspecified: Secondary | ICD-10-CM

## 2016-03-01 LAB — URINALYSIS, ROUTINE W REFLEX MICROSCOPIC
BILIRUBIN URINE: NEGATIVE
GLUCOSE, UA: NEGATIVE mg/dL
KETONES UR: NEGATIVE mg/dL
Nitrite: POSITIVE — AB
PH: 8 (ref 5.0–8.0)
PROTEIN: 100 mg/dL — AB
SQUAMOUS EPITHELIAL / LPF: NONE SEEN
Specific Gravity, Urine: 1.012 (ref 1.005–1.030)

## 2016-03-01 NOTE — ED Provider Notes (Signed)
[place] DEPT Provider Note   CSN: [id number] Arrival date & time: 03/01/16  1928     History   Chief Complaint Chief Complaint  Patient presents with  . Urinary Retention    HPI Gerald Thompson is a 71 y.o. male.  Catheter hasn't drained in over 4 hours, has suprapubic pain and urgency. Similar to previous episodes of cath being blocked. Progressively worsening. No other symptoms. No modifying factors.       Past Medical History:  Diagnosis Date  . Hypertension     Patient Active Problem List   Diagnosis Date Noted  . Abdominal pain 01/04/2016  . Chronic suprapubic catheter ([place]) 01/04/2016  . CKD (chronic kidney disease) stage 3, GFR 30-59 ml/min 01/04/2016  . Nausea & vomiting 01/04/2016  . Pancreatic lesion 01/04/2016    History reviewed. No pertinent surgical history.     Home Medications    Prior to Admission medications   Medication Sig Start Date End Date Taking? Authorizing Provider  amLODipine (NORVASC) 2.5 MG tablet Take 2.5 mg by mouth daily.    Yes Historical Provider, MD  buPROPion (WELLBUTRIN SR) 150 MG 12 hr tablet Take 150 mg by mouth 2 (two) times daily as needed (depression/mood).    Yes Historical Provider, MD  mirtazapine (REMERON) 15 MG tablet Take 7.5 mg by mouth at bedtime as needed (appetite/sleep).   Yes Historical Provider, MD  Multiple Vitamin (MULTI-VITAMINS) TABS Take 2 tablets by mouth every morning.    Yes Historical Provider, MD  oxybutynin (DITROPAN XL) 10 MG 24 hr tablet Take 10 mg by mouth daily.  04/21/15  Yes Historical Provider, MD  ranitidine (ZANTAC) 150 MG capsule Take 150 mg by mouth daily as needed for heartburn.    Yes Historical Provider, MD    Family History History reviewed. No pertinent family history.  Social History Social History  Substance Use Topics  . Smoking status: Former Research scientist (life sciences)  . Smokeless tobacco: Never Used  . Alcohol use No     Allergies   Patient has no known allergies.   Review  of Systems Review of Systems  All other systems reviewed and are negative.    Physical Exam Updated Vital Signs BP 153/98 (BP Location: Left Arm)   Pulse 78   Temp 98.1 F (36.7 C) (Oral)   Resp 18   Ht 6\' 1"  (1.854 m)   Wt 165 lb (74.8 kg)   SpO2 98%   BMI 21.77 kg/m   Physical Exam  Constitutional: He is oriented to person, place, and time. He appears well-developed and well-nourished.  HENT:  Head: Normocephalic and atraumatic.  Eyes: Conjunctivae are normal.  Neck: Normal range of motion.  Cardiovascular: Normal rate.   Pulmonary/Chest: Effort normal. No respiratory distress.  Abdominal: Soft. He exhibits no distension. There is tenderness (suprapubic).  Genitourinary:  Genitourinary Comments: 20 Fr suprapubic catheter in place, not draining, not flushing  Musculoskeletal: Normal range of motion.  Neurological: He is alert and oriented to person, place, and time.  Skin: Skin is warm and dry.  Nursing note and vitals reviewed.    ED Treatments / Results  Labs (all labs ordered are listed, but only abnormal results are displayed) Labs Reviewed  URINE CULTURE - Abnormal; Notable for the following:       Result Value   Culture   (*)    Value: >=100,000 COLONIES/mL GRAM NEGATIVE RODS IDENTIFICATION AND SUSCEPTIBILITIES TO FOLLOW Performed at [facility]    All other components within  normal limits  URINALYSIS, ROUTINE W REFLEX MICROSCOPIC - Abnormal; Notable for the following:    APPearance CLOUDY (*)    Hgb urine dipstick MODERATE (*)    Protein, ur 100 (*)    Nitrite POSITIVE (*)    Leukocytes, UA LARGE (*)    Bacteria, UA FEW (*)    All other components within normal limits    EKG  EKG Interpretation None       Radiology No results found.  Procedures ASPIRATION BLADDER INSERT SUPRAPUBIC CATHETER Date/Time: 03/02/2016 12:04 PM Performed by: Merrily Pew Authorized by: Merrily Pew  Consent: Verbal consent obtained. Risks and  benefits: risks, benefits and alternatives were discussed Consent given by: patient Patient understanding: patient states understanding of the procedure being performed Patient identity confirmed: verbally with patient and hospital-assigned identification number Time out: Immediately prior to procedure a "time out" was called to verify the correct patient, procedure, equipment, support staff and site/side marked as required. Preparation: Patient was prepped and draped in the usual sterile fashion. Local anesthesia used: no  Anesthesia: Local anesthesia used: no  Sedation: Patient sedated: no Patient tolerance: Patient tolerated the procedure well with no immediate complications    (including critical care time)  Medications Ordered in ED Medications - No data to display   Initial Impression / Assessment and Plan / ED Course  I have reviewed the triage vital signs and the nursing notes.  Pertinent labs & imaging results that were available during my care of the patient were reviewed by me and considered in my medical decision making (see chart for details).  Clinical Course     Replaced foley catheter without complication after I couldn't flush old one. Improvement in symptoms. Culture sent on urine, no abx started as he is asymptomatic.   Final Clinical Impressions(s) / ED Diagnoses   Final diagnoses:  Urinary retention    New Prescriptions Discharge Medication List as of 03/01/2016 11:05 PM       Merrily Pew, MD 03/04/16 1038

## 2016-03-01 NOTE — ED Notes (Signed)
Patient and wife verbalized understanding of d/c information. Stated he would follow up with Dr. Amalia Hailey first thing in the morning. Denies pain.

## 2016-03-01 NOTE — ED Notes (Signed)
Foley bag connected to leg bag prior to discharging.

## 2016-03-01 NOTE — ED Notes (Signed)
Suprapubic catheter exchanged by Dr. Dolly Rias. 20Fr catheter reinserted clear yellow urine returned.

## 2016-03-01 NOTE — ED Notes (Signed)
Bed: WA03 Expected date:  Expected time:  Means of arrival:  Comments: Triage 4

## 2016-03-01 NOTE — ED Triage Notes (Signed)
Patient has a 20 Fr foley catheter that was changed two weeks ago but is not draining tonight. On 12/25, patient came to [facility] emergency department for same complaint. The catheter was repositioned but not changed. Last emptied back at 15:30. Pt reports increase in urgency.

## 2016-03-05 LAB — URINE CULTURE: Culture: >=100000 — AB

## 2016-03-05 NOTE — Patient Instructions (Addendum)
Gerald Thompson  03/05/2016   Your procedure is scheduled on: Thursday 03/11/2016  Report to [facility] Main  Entrance take [place]  elevators to 3rd floor to  [place] at   1145 AM.  Call this number if you have problems the morning of surgery [phone]   Remember: ONLY 1 PERSON MAY GO WITH YOU TO SHORT STAY TO GET  READY MORNING OF [place].   Do not eat food  :After Midnight. MAY HAVE CLEAR LIQUIDS FROM MIDNIGHT UP UNTIL  0745 AM THEN NOTHING UNTIL AFTER SURGERY!     CLEAR LIQUID DIET   Foods Allowed                                                                     Foods Excluded  Coffee and tea, regular and decaf                             liquids that you cannot  Plain Jell-O in any flavor                                             see through such as: Fruit ices (not with fruit pulp)                                     milk, soups, orange juice  Iced Popsicles                                    All solid food Carbonated beverages, regular and diet                                    Cranberry, grape and apple juices Sports drinks like Gatorade Lightly seasoned clear broth or consume(fat free) Sugar, honey syrup  Sample Menu Breakfast                                Lunch                                     Supper Cranberry juice                    Beef broth                            Chicken broth Jell-O                                     Grape juice  Apple juice Coffee or tea                        Jell-O                                      Popsicle                                                Coffee or tea                        Coffee or tea  _____________________________________________________________________     Take these medicines the morning of surgery with A SIP OF WATER: Amlodipine                                 You may not have any metal on your body including hair pins and               piercings  Do not wear jewelry, make-up, lotions, powders or perfumes, deodorant             Do not wear nail polish.  Do not shave  48 hours prior to surgery.              Men may shave face and neck.   Do not bring valuables to the hospital. [place].  Contacts, dentures or bridgework may not be worn into surgery.  Leave suitcase in the car. After surgery it may be brought to your room.     Patients discharged the day of surgery will not be allowed to drive home.  Name and phone number of your driver:SPOUSE- [name]  Special Instructions: N/A              Please read over the following fact sheets you were given: _____________________________________________________________________             [facility] - Preparing for Surgery Before surgery, you can play an important role.  Because skin is not sterile, your skin needs to be as free of germs as possible.  You can reduce the number of germs on your skin by washing with CHG (chlorahexidine gluconate) soap before surgery.  CHG is an antiseptic cleaner which kills germs and bonds with the skin to continue killing germs even after washing. Please DO NOT use if you have an allergy to CHG or antibacterial soaps.  If your skin becomes reddened/irritated stop using the CHG and inform your nurse when you arrive at Short Stay. Do not shave (including legs and underarms) for at least 48 hours prior to the first CHG shower.  You may shave your face/neck. Please follow these instructions carefully:  1.  Shower with CHG Soap the night before surgery and the  morning of Surgery.  2.  If you choose to wash your hair, wash your hair first as usual with your  normal  shampoo.  3.  After you shampoo, rinse your hair and body thoroughly to remove the  shampoo.  4.  Use CHG as you would any other liquid soap.  You can apply chg directly  to the skin and wash                        Gently with a scrungie or clean washcloth.  5.  Apply the CHG Soap to your body ONLY FROM THE NECK DOWN.   Do not use on face/ open                           Wound or open sores. Avoid contact with eyes, ears mouth and genitals (private parts).                       Wash face,  Genitals (private parts) with your normal soap.             6.  Wash thoroughly, paying special attention to the area where your surgery  will be performed.  7.  Thoroughly rinse your body with warm water from the neck down.  8.  DO NOT shower/wash with your normal soap after using and rinsing off  the CHG Soap.                9.  Pat yourself dry with a clean towel.            10.  Wear clean pajamas.            11.  Place clean sheets on your bed the night of your first shower and do not  sleep with pets. Day of Surgery : Do not apply any lotions/deodorants the morning of surgery.  Please wear clean clothes to the hospital/surgery center.  FAILURE TO FOLLOW THESE INSTRUCTIONS MAY RESULT IN THE CANCELLATION OF YOUR SURGERY PATIENT SIGNATURE_________________________________  NURSE SIGNATURE__________________________________  ________________________________________________________________________

## 2016-03-06 ENCOUNTER — Telehealth: Payer: Self-pay

## 2016-03-06 NOTE — Progress Notes (Signed)
ED Antimicrobial Stewardship Positive Culture Follow Up   Gerald Thompson is an 71 y.o. male who presented to [facility] on 03/01/2016 with a chief complaint of  Chief Complaint  Patient presents with  . Urinary Retention    Recent Results (from the past 720 hour(s))  Urine culture     Status: Abnormal   Collection Time: 03/01/16 10:53 PM  Result Value Ref Range Status   Specimen Description URINE, RANDOM  Final   Special Requests NONE  Final   Culture >=100,000 COLONIES/mL PROVIDENCIA RETTGERI (A)  Final   Report Status 03/05/2016 FINAL  Final   Organism ID, Bacteria PROVIDENCIA RETTGERI (A)  Final      Susceptibility   Providencia rettgeri - MIC*    AMPICILLIN 16 RESISTANT Resistant     CEFAZOLIN >=64 RESISTANT Resistant     CEFTRIAXONE <=1 SENSITIVE Sensitive     CIPROFLOXACIN <=0.25 SENSITIVE Sensitive     GENTAMICIN <=1 SENSITIVE Sensitive     IMIPENEM 1 SENSITIVE Sensitive     NITROFURANTOIN 256 RESISTANT Resistant     TRIMETH/SULFA <=20 SENSITIVE Sensitive     AMPICILLIN/SULBACTAM 16 INTERMEDIATE Intermediate     PIP/TAZO <=4 SENSITIVE Sensitive     * >=100,000 COLONIES/mL PROVIDENCIA RETTGERI    []  Treated with N/A, organism resistant to prescribed antimicrobial [x]  Patient discharged originally without antimicrobial agent and treatment is now indicated  New antibiotic prescription: If symptomatic, cefpodoxime 100mg  PO daily x 10 days  ED Provider: Arlean Hopping, PA   [place], Rande Lawman 03/06/2016, 10:32 AM Clinical Pharmacist Phone# [phone]

## 2016-03-06 NOTE — Telephone Encounter (Signed)
Pt states he is having no further problems. Has appt with Urologist in 3 weeks.

## 2016-03-08 ENCOUNTER — Encounter (HOSPITAL_COMMUNITY)
Admission: RE | Admit: 2016-03-08 | Discharge: 2016-03-08 | Disposition: A | Discharge status: Home / Self Care | Payer: BLUE CROSS/BLUE SHIELD | Source: Ambulatory Visit | Attending: Surgery | Admitting: Surgery

## 2016-03-08 ENCOUNTER — Encounter (INDEPENDENT_AMBULATORY_CARE_PROVIDER_SITE_OTHER): Payer: Self-pay

## 2016-03-08 ENCOUNTER — Encounter (HOSPITAL_COMMUNITY): Payer: Self-pay

## 2016-03-08 DIAGNOSIS — F17210 Nicotine dependence, cigarettes, uncomplicated: Secondary | ICD-10-CM | POA: Diagnosis not present

## 2016-03-08 DIAGNOSIS — K4020 Bilateral inguinal hernia, without obstruction or gangrene, not specified as recurrent: Secondary | ICD-10-CM | POA: Insufficient documentation

## 2016-03-08 DIAGNOSIS — Z01810 Encounter for preprocedural cardiovascular examination: Secondary | ICD-10-CM

## 2016-03-08 DIAGNOSIS — F329 Major depressive disorder, single episode, unspecified: Secondary | ICD-10-CM | POA: Diagnosis not present

## 2016-03-08 DIAGNOSIS — Z01812 Encounter for preprocedural laboratory examination: Secondary | ICD-10-CM

## 2016-03-08 DIAGNOSIS — K219 Gastro-esophageal reflux disease without esophagitis: Secondary | ICD-10-CM | POA: Diagnosis not present

## 2016-03-08 DIAGNOSIS — F419 Anxiety disorder, unspecified: Secondary | ICD-10-CM | POA: Diagnosis not present

## 2016-03-08 DIAGNOSIS — I10 Essential (primary) hypertension: Secondary | ICD-10-CM | POA: Diagnosis not present

## 2016-03-08 DIAGNOSIS — Z86010 Personal history of colonic polyps: Secondary | ICD-10-CM | POA: Diagnosis not present

## 2016-03-08 HISTORY — DX: Gastro-esophageal reflux disease without esophagitis: K21.9

## 2016-03-08 LAB — CBC
HCT: 42.7 % (ref 39.0–52.0)
Hemoglobin: 14.1 g/dL (ref 13.0–17.0)
MCH: 30.3 pg (ref 26.0–34.0)
MCHC: 33 g/dL (ref 30.0–36.0)
MCV: 91.6 fL (ref 78.0–100.0)
PLATELETS: 355 10*3/uL (ref 150–400)
RBC: 4.66 MIL/uL (ref 4.22–5.81)
RDW: 13.2 % (ref 11.5–15.5)
WBC: 6 10*3/uL (ref 4.0–10.5)

## 2016-03-08 LAB — BASIC METABOLIC PANEL
Anion gap: 8 (ref 5–15)
BUN: 42 mg/dL — AB (ref 6–20)
CHLORIDE: 105 mmol/L (ref 101–111)
CO2: 25 mmol/L (ref 22–32)
Calcium: 9.3 mg/dL (ref 8.9–10.3)
Creatinine, Ser: 3.24 mg/dL — ABNORMAL HIGH (ref 0.61–1.24)
GFR calc non Af Amer: 18 mL/min — ABNORMAL LOW (ref >60)
GFR, EST AFRICAN AMERICAN: 21 mL/min — AB (ref >60)
GLUCOSE: 78 mg/dL (ref 65–99)
Potassium: 4.7 mmol/L (ref 3.5–5.1)
Sodium: 138 mmol/L (ref 135–145)

## 2016-03-08 NOTE — Progress Notes (Signed)
Called Dr.  Alona Bene office and spoke with Nevada Crane, scheduler who will inform nurse of lab results and will call me back with any questions!

## 2016-03-08 NOTE — Progress Notes (Signed)
Consulted with Dr. Tamela Gammon, Anesthesia concerning abnormal BUN and Creatinine. Per Dr. Tobias Alexander, notify Surgeon and Urologist-Dr. Amalia Hailey!

## 2016-03-10 NOTE — H&P (Signed)
Gerald Thompson 02/13/2016 10:28 AM Location: [facility] Patient #: [id number] DOB: [birth year] Married / Language: English / Race: Black or African American Male   History of Present Illness Gerald Thompson A. Ninfa Linden MD; 02/13/2016 10:50 AM) The patient is a 71 year old male who presents with an inguinal hernia. This is a gentleman who was actually seen last month in the hospital by Dr. Hassell Done for bilateral inguinal hernias. He was in for nausea from another source and has a suprapubic catheter for an inactive bladder which was placed by Dr. Amalia Hailey and was in [place]. He is here today to discuss surgical intervention for his hernias. He has had no obstructive symptoms from the hernia. He reports he has had hernias on both sides for many years. He is uncertain of how long he will be having the suprapubic catheter in place   Past Surgical History Benjiman Core, [place]; 02/13/2016 10:28 AM) Colon Polyp Removal - Colonoscopy  Colon Polyp Removal - Open  Resection of Small Bowel   Diagnostic Studies History Benjiman Core, CMA; 02/13/2016 10:28 AM) Colonoscopy  within last year  Allergies Benjiman Core, [place]; 02/13/2016 10:29 AM) No Known Drug Allergies 02/13/2016  Medication History Benjiman Core, CMA; 02/13/2016 10:31 AM) Oxybutynin Chloride (5MG  Tablet, Oral) Active. Norvasc (2.5MG  Tablet, Oral) Active. Wellbutrin SR (150MG  Tablet ER 12HR, Oral) Active. Multivitamin Adult (Oral) Active. Zantac (150MG  Capsule, Oral) Active. Medications Reconciled  Social History Benjiman Core, CMA; 02/13/2016 10:28 AM) Caffeine use  Carbonated beverages. Tobacco use  Current some day smoker.  Family History Benjiman Core, [place]; 02/13/2016 10:28 AM) Colon Cancer  Brother, Father.  Other Problems Benjiman Core, CMA; 02/13/2016 10:28 AM) Anxiety Disorder  Bladder Problems  Chronic Renal Failure Syndrome  Depression  Gastroesophageal Reflux Disease     Review of Systems (Armen  Glenn CMA; 02/13/2016 10:28 AM) General Not Present- Appetite Loss, Chills, Fatigue, Fever, Night Sweats, Weight Gain and Weight Loss. Skin Not Present- Change in Wart/Mole, Dryness, Hives, Jaundice, New Lesions, Non-Healing Wounds, Rash and Ulcer. HEENT Present- Hearing Loss. Not Present- Earache, Hoarseness, Nose Bleed, Oral Ulcers, Ringing in the Ears, Seasonal Allergies, Sinus Pain, Sore Throat, Visual Disturbances, Wears glasses/contact lenses and Yellow Eyes. Respiratory Not Present- Bloody sputum, Chronic Cough, Difficulty Breathing, Snoring and Wheezing. Breast Not Present- Breast Mass, Breast Pain, Nipple Discharge and Skin Changes. Cardiovascular Not Present- Chest Pain, Difficulty Breathing Lying Down, Leg Cramps, Palpitations, Rapid Heart Rate, Shortness of Breath and Swelling of Extremities. Gastrointestinal Present- Abdominal Pain, Excessive gas and Indigestion. Not Present- Bloating, Bloody Stool, Change in Bowel Habits, Chronic diarrhea, Constipation, Difficulty Swallowing, Gets full quickly at meals, Hemorrhoids, Nausea, Rectal Pain and Vomiting. Male Genitourinary Present- Change in Urinary Stream. Not Present- Blood in Urine, Frequency, Impotence, Nocturia, Painful Urination, Urgency and Urine Leakage. Musculoskeletal Not Present- Back Pain, Joint Pain, Joint Stiffness, Muscle Pain, Muscle Weakness and Swelling of Extremities. Neurological Not Present- Decreased Memory, Fainting, Headaches, Numbness, Seizures, Tingling, Tremor, Trouble walking and Weakness. Psychiatric Present- Anxiety. Not Present- Bipolar, Change in Sleep Pattern, Depression, Fearful and Frequent crying. Endocrine Not Present- Cold Intolerance, Excessive Hunger, Hair Changes, Heat Intolerance, Hot flashes and New Diabetes. Hematology Not Present- Blood Thinners, Easy Bruising, Excessive bleeding, Gland problems, HIV and Persistent Infections.  Vitals (Armen Glenn CMA; 02/13/2016 10:29 AM) 02/13/2016 10:28  AM Weight: 164 lb Height: 73in Body Surface Area: 1.98 m Body Mass Index: 21.64 kg/m  Temp.: 40F  Pulse: 80 (Regular)  P.OX: 98% (Room air) BP: 144/92 (Sitting, Left Arm, Standard)  Physical Exam (Gerald Thompson A. Ninfa Linden MD; 02/13/2016 10:50 AM) The physical exam findings are as follows: Note:On examination, he has bilateral reducible inguinal hernias as well as a suprapubic catheter Lungs clear CV RRR Ext without edema Skin without rash   Assessment & Plan (Courtnei Ruddell A. 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 I discussed bilateral opening or hernia repair with mesh. I discussed the risks which include infection of the mesh given the catheter. He is not a candidate for laparoscopic surgery given the location of the catheter. I discussed the other risks of surgery with him as well. He understands and wishes to proceed with surgery

## 2016-03-11 ENCOUNTER — Ambulatory Visit (HOSPITAL_COMMUNITY): Payer: BLUE CROSS/BLUE SHIELD | Admitting: Anesthesiology

## 2016-03-11 ENCOUNTER — Ambulatory Visit (HOSPITAL_COMMUNITY)
Admission: RE | Admit: 2016-03-11 | Discharge: 2016-03-11 | Disposition: A | Discharge status: Home / Self Care | Payer: BLUE CROSS/BLUE SHIELD | Source: Ambulatory Visit | Attending: Surgery | Admitting: Surgery

## 2016-03-11 ENCOUNTER — Encounter (HOSPITAL_COMMUNITY): Payer: Self-pay | Admitting: *Deleted

## 2016-03-11 ENCOUNTER — Encounter (HOSPITAL_COMMUNITY)
Admission: RE | Disposition: A | Discharge status: Home / Self Care | Payer: Self-pay | Source: Ambulatory Visit | Attending: Surgery

## 2016-03-11 DIAGNOSIS — K219 Gastro-esophageal reflux disease without esophagitis: Secondary | ICD-10-CM | POA: Diagnosis not present

## 2016-03-11 DIAGNOSIS — G8918 Other acute postprocedural pain: Secondary | ICD-10-CM | POA: Diagnosis not present

## 2016-03-11 DIAGNOSIS — K4020 Bilateral inguinal hernia, without obstruction or gangrene, not specified as recurrent: Secondary | ICD-10-CM | POA: Diagnosis not present

## 2016-03-11 DIAGNOSIS — F329 Major depressive disorder, single episode, unspecified: Secondary | ICD-10-CM | POA: Insufficient documentation

## 2016-03-11 DIAGNOSIS — N183 Chronic kidney disease, stage 3 (moderate): Secondary | ICD-10-CM | POA: Diagnosis not present

## 2016-03-11 DIAGNOSIS — F419 Anxiety disorder, unspecified: Secondary | ICD-10-CM | POA: Insufficient documentation

## 2016-03-11 DIAGNOSIS — Z86010 Personal history of colonic polyps: Secondary | ICD-10-CM | POA: Diagnosis not present

## 2016-03-11 DIAGNOSIS — F17210 Nicotine dependence, cigarettes, uncomplicated: Secondary | ICD-10-CM | POA: Insufficient documentation

## 2016-03-11 DIAGNOSIS — I10 Essential (primary) hypertension: Secondary | ICD-10-CM | POA: Diagnosis not present

## 2016-03-11 DIAGNOSIS — R109 Unspecified abdominal pain: Secondary | ICD-10-CM | POA: Diagnosis not present

## 2016-03-11 HISTORY — DX: Disorder of kidney and ureter, unspecified: N28.9

## 2016-03-11 HISTORY — PX: INSERTION OF MESH: SHX5868

## 2016-03-11 HISTORY — PX: INGUINAL HERNIA REPAIR: SHX194

## 2016-03-11 SURGERY — REPAIR, HERNIA, INGUINAL, BILATERAL, ADULT
Anesthesia: General | Laterality: Bilateral

## 2016-03-11 MED ORDER — ONDANSETRON HCL 4 MG/2ML IJ SOLN: 4.0000 mg | Freq: Four times a day (QID) | INTRAMUSCULAR | Status: DC | PRN

## 2016-03-11 MED ORDER — BUPIVACAINE HCL (PF) 0.5 % IJ SOLN
INTRAMUSCULAR | Status: DC | PRN
  Administered 2016-03-11: 20 mL

## 2016-03-11 MED ORDER — ACETAMINOPHEN 325 MG PO TABS: 650.0000 mg | ORAL_TABLET | ORAL | Status: DC | PRN

## 2016-03-11 MED ORDER — FENTANYL CITRATE (PF) 100 MCG/2ML IJ SOLN
INTRAMUSCULAR | Status: AC
  Filled 2016-03-11: qty 2

## 2016-03-11 MED ORDER — MIDAZOLAM HCL 5 MG/ML IJ SOLN
2.0000 mg | Freq: Once | INTRAMUSCULAR | Status: AC
  Administered 2016-03-11: 2 mg via INTRAVENOUS

## 2016-03-11 MED ORDER — SODIUM CHLORIDE 0.9% FLUSH: 3.0000 mL | INTRAVENOUS | Status: DC | PRN

## 2016-03-11 MED ORDER — CEFAZOLIN SODIUM-DEXTROSE 2-4 GM/100ML-% IV SOLN
2.0000 g | INTRAVENOUS | Status: AC
  Administered 2016-03-11: 2 g via INTRAVENOUS
  Filled 2016-03-11: qty 100

## 2016-03-11 MED ORDER — EPINEPHRINE PF 1 MG/ML IJ SOLN
INTRAMUSCULAR | Status: AC
  Filled 2016-03-11: qty 1

## 2016-03-11 MED ORDER — BUPIVACAINE HCL (PF) 0.5 % IJ SOLN
INTRAMUSCULAR | Status: AC
  Filled 2016-03-11: qty 30

## 2016-03-11 MED ORDER — PROPOFOL 10 MG/ML IV BOLUS
INTRAVENOUS | Status: DC | PRN
  Administered 2016-03-11: 170 mg via INTRAVENOUS

## 2016-03-11 MED ORDER — LIDOCAINE 2% (20 MG/ML) 5 ML SYRINGE
INTRAMUSCULAR | Status: AC
  Filled 2016-03-11: qty 5

## 2016-03-11 MED ORDER — LIDOCAINE 2% (20 MG/ML) 5 ML SYRINGE
INTRAMUSCULAR | Status: DC | PRN
  Administered 2016-03-11: 60 mg via INTRAVENOUS

## 2016-03-11 MED ORDER — SODIUM CHLORIDE 0.9% FLUSH: 3.0000 mL | Freq: Two times a day (BID) | INTRAVENOUS | Status: DC

## 2016-03-11 MED ORDER — PHENYLEPHRINE 40 MCG/ML (10ML) SYRINGE FOR IV PUSH (FOR BLOOD PRESSURE SUPPORT)
PREFILLED_SYRINGE | INTRAVENOUS | Status: AC
Start: 2016-03-11 — End: 2016-03-11
  Filled 2016-03-11: qty 10

## 2016-03-11 MED ORDER — ACETAMINOPHEN 650 MG RE SUPP
650.0000 mg | RECTAL | Status: DC | PRN
  Filled 2016-03-11: qty 1

## 2016-03-11 MED ORDER — ONDANSETRON HCL 4 MG/2ML IJ SOLN
INTRAMUSCULAR | Status: DC | PRN
  Administered 2016-03-11: 4 mg via INTRAVENOUS

## 2016-03-11 MED ORDER — CHLORHEXIDINE GLUCONATE CLOTH 2 % EX PADS: 6.0000 | MEDICATED_PAD | Freq: Once | CUTANEOUS | Status: DC

## 2016-03-11 MED ORDER — MORPHINE SULFATE (PF) 10 MG/ML IV SOLN: 1.0000 mg | INTRAVENOUS | Status: DC | PRN

## 2016-03-11 MED ORDER — EPHEDRINE 5 MG/ML INJ
INTRAVENOUS | Status: AC
  Filled 2016-03-11: qty 10

## 2016-03-11 MED ORDER — ONDANSETRON HCL 4 MG/2ML IJ SOLN
INTRAMUSCULAR | Status: AC
  Filled 2016-03-11: qty 2

## 2016-03-11 MED ORDER — DEXAMETHASONE SODIUM PHOSPHATE 10 MG/ML IJ SOLN
INTRAMUSCULAR | Status: AC
  Filled 2016-03-11: qty 1

## 2016-03-11 MED ORDER — PROPOFOL 10 MG/ML IV BOLUS
INTRAVENOUS | Status: AC
  Filled 2016-03-11: qty 20

## 2016-03-11 MED ORDER — OXYCODONE HCL 5 MG PO TABS
5.0000 mg | ORAL_TABLET | ORAL | Status: DC | PRN
  Administered 2016-03-11: 5 mg via ORAL
  Filled 2016-03-11: qty 1

## 2016-03-11 MED ORDER — 0.9 % SODIUM CHLORIDE (POUR BTL) OPTIME
TOPICAL | Status: DC | PRN
  Administered 2016-03-11: 1000 mL

## 2016-03-11 MED ORDER — MIDAZOLAM HCL 2 MG/2ML IJ SOLN
INTRAMUSCULAR | Status: AC
  Filled 2016-03-11: qty 2

## 2016-03-11 MED ORDER — FENTANYL CITRATE (PF) 100 MCG/2ML IJ SOLN
INTRAMUSCULAR | Status: DC | PRN
  Administered 2016-03-11 (×2): 25 ug via INTRAVENOUS

## 2016-03-11 MED ORDER — SUGAMMADEX SODIUM 200 MG/2ML IV SOLN
INTRAVENOUS | Status: AC
  Filled 2016-03-11: qty 2

## 2016-03-11 MED ORDER — CEFAZOLIN SODIUM-DEXTROSE 2-4 GM/100ML-% IV SOLN
INTRAVENOUS | Status: AC
  Filled 2016-03-11: qty 100

## 2016-03-11 MED ORDER — OXYCODONE HCL 5 MG/5ML PO SOLN: 5.0000 mg | Freq: Once | ORAL | Status: DC | PRN

## 2016-03-11 MED ORDER — PHENYLEPHRINE HCL 10 MG/ML IJ SOLN
INTRAMUSCULAR | Status: DC | PRN
  Administered 2016-03-11 (×3): 80 ug via INTRAVENOUS

## 2016-03-11 MED ORDER — BUPIVACAINE-EPINEPHRINE (PF) 0.5% -1:200000 IJ SOLN
INTRAMUSCULAR | Status: DC | PRN
  Administered 2016-03-11 (×2): 15 mL via PERINEURAL

## 2016-03-11 MED ORDER — DEXAMETHASONE SODIUM PHOSPHATE 10 MG/ML IJ SOLN
INTRAMUSCULAR | Status: DC | PRN
  Administered 2016-03-11: 10 mg via INTRAVENOUS

## 2016-03-11 MED ORDER — OXYCODONE HCL 5 MG PO TABS: 5.0000 mg | ORAL_TABLET | Freq: Once | ORAL | Status: DC | PRN

## 2016-03-11 MED ORDER — FENTANYL CITRATE (PF) 100 MCG/2ML IJ SOLN
50.0000 ug | Freq: Once | INTRAMUSCULAR | Status: AC
  Administered 2016-03-11: 50 ug via INTRAVENOUS

## 2016-03-11 MED ORDER — LABETALOL HCL 5 MG/ML IV SOLN
INTRAVENOUS | Status: AC
  Filled 2016-03-11: qty 4

## 2016-03-11 MED ORDER — HYDROCODONE-ACETAMINOPHEN 5-325 MG PO TABS: 1.0000 | ORAL_TABLET | ORAL | 0 refills | Status: AC | PRN

## 2016-03-11 MED ORDER — LABETALOL HCL 5 MG/ML IV SOLN
10.0000 mg | Freq: Once | INTRAVENOUS | Status: AC
  Administered 2016-03-11: 10 mg via INTRAVENOUS

## 2016-03-11 MED ORDER — SODIUM CHLORIDE 0.9 % IV SOLN: 250.0000 mL | INTRAVENOUS | Status: DC | PRN

## 2016-03-11 MED ORDER — FENTANYL CITRATE (PF) 100 MCG/2ML IJ SOLN: 25.0000 ug | INTRAMUSCULAR | Status: DC | PRN

## 2016-03-11 MED ORDER — SODIUM CHLORIDE 0.9 % IV SOLN
INTRAVENOUS | Status: DC | PRN
  Administered 2016-03-11: 1000 mL
  Administered 2016-03-11: 14:00:00 via INTRAVENOUS

## 2016-03-11 MED ORDER — EPHEDRINE SULFATE 50 MG/ML IJ SOLN
INTRAMUSCULAR | Status: DC | PRN
  Administered 2016-03-11 (×3): 10 mg via INTRAVENOUS

## 2016-03-11 MED ORDER — LACTATED RINGERS IV SOLN
INTRAVENOUS | Status: DC
  Administered 2016-03-11: 13:00:00 via INTRAVENOUS

## 2016-03-11 SURGICAL SUPPLY — 47 items
ADH SKN CLS APL DERMABOND .7 (GAUZE/BANDAGES/DRESSINGS) ×1
BLADE HEX COATED 2.75 (ELECTRODE) ×3 IMPLANT
BLADE SURG 15 STRL LF DISP TIS (BLADE) ×1 IMPLANT
BLADE SURG 15 STRL SS (BLADE) ×3
BLADE SURG SZ10 CARB STEEL (BLADE) ×3 IMPLANT
CHLORAPREP W/TINT 26ML (MISCELLANEOUS) ×3 IMPLANT
CLOSURE WOUND 1/2 X4 (GAUZE/BANDAGES/DRESSINGS)
COVER SURGICAL LIGHT HANDLE (MISCELLANEOUS) ×3 IMPLANT
DECANTER SPIKE VIAL GLASS SM (MISCELLANEOUS) ×2 IMPLANT
DERMABOND ADVANCED (GAUZE/BANDAGES/DRESSINGS) ×2
DERMABOND ADVANCED .7 DNX12 (GAUZE/BANDAGES/DRESSINGS) IMPLANT
DRAIN PENROSE 18X1/2 LTX STRL (DRAIN) ×3 IMPLANT
DRAPE LAPAROSCOPIC ABDOMINAL (DRAPES) ×3 IMPLANT
DRSG TEGADERM 2-3/8X2-3/4 SM (GAUZE/BANDAGES/DRESSINGS) ×2 IMPLANT
ELECT PENCIL ROCKER SW 15FT (MISCELLANEOUS) ×3 IMPLANT
ELECT REM PT RETURN 9FT ADLT (ELECTROSURGICAL) ×3
ELECTRODE REM PT RTRN 9FT ADLT (ELECTROSURGICAL) ×1 IMPLANT
GAUZE SPONGE 2X2 8PLY STRL LF (GAUZE/BANDAGES/DRESSINGS) IMPLANT
GAUZE SPONGE 4X4 12PLY STRL (GAUZE/BANDAGES/DRESSINGS) IMPLANT
GAUZE SPONGE 4X4 16PLY XRAY LF (GAUZE/BANDAGES/DRESSINGS) IMPLANT
GLOVE BIOGEL PI IND STRL 7.0 (GLOVE) ×1 IMPLANT
GLOVE BIOGEL PI INDICATOR 7.0 (GLOVE) ×2
GLOVE SURG SIGNA 7.5 PF LTX (GLOVE) ×6 IMPLANT
GOWN STRL REUS W/TWL LRG LVL3 (GOWN DISPOSABLE) ×3 IMPLANT
GOWN STRL REUS W/TWL XL LVL3 (GOWN DISPOSABLE) ×6 IMPLANT
KIT BASIN OR (CUSTOM PROCEDURE TRAY) ×3 IMPLANT
MESH PARIETEX PROGRIP LEFT (Mesh General) ×2 IMPLANT
MESH PARIETEX PROGRIP RIGHT (Mesh General) ×2 IMPLANT
NDL HYPO 25X1 1.5 SAFETY (NEEDLE) ×1 IMPLANT
NEEDLE HYPO 22GX1.5 SAFETY (NEEDLE) IMPLANT
NEEDLE HYPO 25X1 1.5 SAFETY (NEEDLE) ×3 IMPLANT
PACK BASIC VI WITH GOWN DISP (CUSTOM PROCEDURE TRAY) ×3 IMPLANT
SPONGE GAUZE 2X2 STER 10/PKG (GAUZE/BANDAGES/DRESSINGS) ×2
SPONGE LAP 4X18 X RAY DECT (DISPOSABLE) ×3 IMPLANT
STRIP CLOSURE SKIN 1/2X4 (GAUZE/BANDAGES/DRESSINGS) IMPLANT
SUT MNCRL AB 4-0 PS2 18 (SUTURE) ×3 IMPLANT
SUT SILK 2 0 SH (SUTURE) ×3 IMPLANT
SUT VIC AB 2-0 CT1 27 (SUTURE)
SUT VIC AB 2-0 CT1 TAPERPNT 27 (SUTURE) IMPLANT
SUT VIC AB 3-0 54XBRD REEL (SUTURE) IMPLANT
SUT VIC AB 3-0 BRD 54 (SUTURE)
SUT VIC AB 3-0 SH 27 (SUTURE)
SUT VIC AB 3-0 SH 27XBRD (SUTURE) IMPLANT
SYR BULB IRRIGATION 50ML (SYRINGE) IMPLANT
SYR CONTROL 10ML LL (SYRINGE) ×3 IMPLANT
TOWEL OR 17X26 10 PK STRL BLUE (TOWEL DISPOSABLE) ×3 IMPLANT
YANKAUER SUCT BULB TIP 10FT TU (MISCELLANEOUS) ×3 IMPLANT

## 2016-03-11 NOTE — Anesthesia Procedure Notes (Signed)
Procedure Name: LMA Insertion Date/Time: 03/11/2016 1:40 PM Performed by: Danley Danker L Patient Re-evaluated:Patient Re-evaluated prior to inductionOxygen Delivery Method: Circle system utilized Preoxygenation: Pre-oxygenation with 100% oxygen Intubation Type: IV induction LMA: LMA inserted LMA Size: 4.0 Number of attempts: 1 Placement Confirmation: positive ETCO2 Tube secured with: Tape Dental Injury: Teeth and Oropharynx as per pre-operative assessment

## 2016-03-11 NOTE — Discharge Instructions (Signed)
General Anesthesia, Adult, Care After These instructions provide you with information about caring for yourself after your procedure. Your health care provider may also give you more specific instructions. Your treatment has been planned according to current medical practices, but problems sometimes occur. Call your health care provider if you have any problems or questions after your procedure. What can I expect after the procedure? After the procedure, it is common to have:  Vomiting.  A sore throat.  Mental slowness. It is common to feel:  Nauseous.  Cold or shivery.  Sleepy.  Tired.  Sore or achy, even in parts of your body where you did not have surgery. Follow these instructions at home: For at least 24 hours after the procedure:  Do not:  Participate in activities where you could fall or become injured.  Drive.  Use heavy machinery.  Drink alcohol.  Take sleeping pills or medicines that cause drowsiness.  Make important decisions or sign legal documents.  Take care of children on your own.  Rest. Eating and drinking  If you vomit, drink water, juice, or soup when you can drink without vomiting.  Drink enough fluid to keep your urine clear or pale yellow.  Make sure you have little or no nausea before eating solid foods.  Follow the diet recommended by your health care provider. General instructions  Have a responsible adult stay with you until you are awake and alert.  Return to your normal activities as told by your health care provider. Ask your health care provider what activities are safe for you.  Take over-the-counter and prescription medicines only as told by your health care provider.  If you smoke, do not smoke without supervision.  Keep all follow-up visits as told by your health care provider. This is important. Contact a health care provider if:  You continue to have nausea or vomiting at home, and medicines are not helpful.  You  cannot drink fluids or start eating again.  You cannot urinate after 8-12 hours.  You develop a skin rash.  You have fever.  You have increasing redness at the site of your procedure. Get help right away if:  You have difficulty breathing.  You have chest pain.  You have unexpected bleeding.  You feel that you are having a life-threatening or urgent problem. This information is not intended to replace advice given to you by your health care provider. Make sure you discuss any questions you have with your health care provider. Document Released: 05/24/2000 Document Revised: 07/21/2015 Document Reviewed: 01/30/2015 Elsevier Interactive Patient Education  2017 [place] _______[facility]  UMBILICAL OR INGUINAL HERNIA REPAIR: POST OP INSTRUCTIONS  Always review your discharge instruction sheet given to you by the facility where your surgery was performed. IF YOU HAVE DISABILITY OR FAMILY LEAVE FORMS, YOU MUST BRING THEM TO THE OFFICE FOR PROCESSING.   DO NOT GIVE THEM TO YOUR DOCTOR.  1. A  prescription for pain medication may be given to you upon discharge.  Take your pain medication as prescribed, if needed.  If narcotic pain medicine is not needed, then you may take acetaminophen (Tylenol) or ibuprofen (Advil) as needed. 2. Take your usually prescribed medications unless otherwise directed. If you need a refill on your pain medication, please contact your pharmacy.  They will contact our office to request authorization. Prescriptions will not be filled after 5 pm or on week-ends. 3. You should follow a light diet the first 24  hours after arrival home, such as soup and crackers, etc.  Be sure to include lots of fluids daily.  Resume your normal diet the day after surgery. 4.Most patients will experience some swelling and bruising around the umbilicus or in the groin and scrotum.  Ice packs and reclining will help.  Swelling and bruising can take several  days to resolve.  6. It is common to experience some constipation if taking pain medication after surgery.  Increasing fluid intake and taking a stool softener (such as Colace) will usually help or prevent this problem from occurring.  A mild laxative (Milk of Magnesia or Miralax) should be taken according to package directions if there are no bowel movements after 48 hours. 7. Unless discharge instructions indicate otherwise, you may remove your bandages 24-48 hours after surgery, and you may shower at that time.  You may have steri-strips (small skin tapes) in place directly over the incision.  These strips should be left on the skin for 7-10 days.  If your surgeon used skin glue on the incision, you may shower in 24 hours.  The glue will flake off over the next 2-3 weeks.  Any sutures or staples will be removed at the office during your follow-up visit. 8. ACTIVITIES:  You may resume regular (light) daily activities beginning the next day--such as daily self-care, walking, climbing stairs--gradually increasing activities as tolerated.  You may have sexual intercourse when it is comfortable.  Refrain from any heavy lifting or straining until approved by your doctor.  a.You may drive when you are no longer taking prescription pain medication, you can comfortably wear a seatbelt, and you can safely maneuver your car and apply brakes. b.RETURN TO WORK:   _____________________________________________  9.You should see your doctor in the office for a follow-up appointment approximately 2-3 weeks after your surgery.  Make sure that you call for this appointment within a day or two after you arrive home to insure a convenient appointment time. 10.OTHER INSTRUCTIONS: _NO LIFTING MORE THAN 15 POUNDS FOR 4 WEEKS________________________    _____________________________________  WHEN TO CALL YOUR DOCTOR: 1. Fever over 101.0 2. Inability to urinate 3. Nausea and/or vomiting 4. Extreme swelling or  bruising 5. Continued bleeding from incision. 6. Increased pain, redness, or drainage from the incision  The clinic staff is available to answer your questions during regular business hours.  Please dont hesitate to call and ask to speak to one of the nurses for clinical concerns.  If you have a medical emergency, go to the nearest emergency room or call 911.  A surgeon from [facility] is always on call at the hospital   [address], [place], [place], [place]  [address] ?  [address], [place], [place]   [address] [phone] ? [phone] ? FAX [phone] Web site: [email or web address]

## 2016-03-11 NOTE — Progress Notes (Signed)
Patient's leg bag changed to gravity drainage bag on indwelling suprapubic catheter.

## 2016-03-11 NOTE — Op Note (Signed)
OPEN BILATERAL INGUINAL HERNIA REPAIR, INSERTION OF MESH  Procedure Note  Gerald Thompson 03/11/2016   Pre-op Diagnosis: bilateral inguinal hernia     Post-op Diagnosis: same  Procedure(s): OPEN BILATERAL INGUINAL HERNIA REPAIR INSERTION OF MESH (bilateral Covidien progrip mesh  Surgeon(s): Coralie Keens, MD  Anesthesia: General  Staff:  Circulator: Lenoard Aden, RN; Tamala Julian, RN Scrub Person: Joellen Jersey, CST; Eiliana Portell, CST  Estimated Blood Loss: Minimal               Indications: This is a 71 year old gentleman with bilateral symptomatic inguinal hernias. He also has a suprapubic catheter in place. The decision of the major proceed with open repair with mesh.  Findings: The patient was found to have high lateral direct inguinal hernias which were repaired with 2 separate pieces of proline pro-grip mesh from Covidien  Procedure: The patient was brought to the operating room and identified as the correct patient. He is placed upon the operating table general seizures induced. We taped his superpubic catheter out away with a piece of Tegaderm. His abdomen was then prepped and draped in usual sterile fashion. I anesthetized skin in both inguinal areas with Marcaine. I made 2 separate longitudinal incisions in each inguinal area with a scalpel. I then took both incisions down through Scarpa's fascia with electrocautery. I opened up both sides external bleak fascia was identified to secure the cord structures and controlled both with Penrose drains. The patient had bilateral direct hernia defects. There was no evidence of indirect hernia defects. I reduced both sacs and then closed the floor of the top of the sacs with figure-of-eight 2-0 Vicryl sutures on both sides. I then brought a right-sided and left-sided piece of proline pro-grip mesh onto the field. I placed each mesh on the appropriate side as an on lay lingual floor and then brought around the cord structures. I  secured both these of mesh on each side to the pubic tubercle with 2-0 Vicryl sutures. Wide coverage of the inguinal floors appear to be achieved. I then closed both sides external fascia was with running 2-0 Vicryl sutures. I then closed both sides Scarpa's fascia with 3-0 Vicryl sutures and ran both incisions closed with 4-0 Monocryl sutures. Dermabond was then applied to both incisions. The patient tolerated procedure well. All counts were correct at the end procedure. The patient was then extubated in the operating room and taken in a stable condition to the recovery room.          [name] A   Date: 03/11/2016  Time: 2:29 PM

## 2016-03-11 NOTE — Anesthesia Procedure Notes (Signed)
Anesthesia Regional Block:  TAP block  Pre-Anesthetic Checklist: ,, timeout performed, Correct Patient, Correct Site, Correct Laterality, Correct Procedure, Correct Position, site marked, Risks and benefits discussed,  Surgical consent,  Pre-op evaluation,  At surgeon's request and post-op pain management  Laterality: Left and Right  Prep: chloraprep       Needles:  Injection technique: Single-shot  Needle Type: Echogenic Needle     Needle Length: 9cm 9 cm Needle Gauge: 21 and 21 G    Additional Needles:  Procedures: ultrasound guided (picture in chart) TAP block Narrative:  Start time: 03/11/2016 1:11 PM End time: 03/11/2016 1:19 PM Injection made incrementally with aspirations every 5 mL.  Performed by: Personally  Anesthesiologist: Laurann Mcmorris  Additional Notes: Pt tolerated the procedure well.  Bilateral TAP blocks performed.  10mL of 0.5% bupivacaine +epi was injected on each side.  Sterile technique and ultrasound guidance was used.    Left side:   Right side:

## 2016-03-11 NOTE — Anesthesia Preprocedure Evaluation (Signed)
Anesthesia Evaluation  Patient identified by MRN, date of birth, ID band Patient awake    Reviewed: Allergy & Precautions, H&P , NPO status , Patient's Chart, lab work & pertinent test results  Airway Mallampati: II   Neck ROM: full    Dental   Pulmonary former smoker,    breath sounds clear to auscultation       Cardiovascular hypertension,  Rhythm:regular Rate:Normal     Neuro/Psych    GI/Hepatic GERD  ,  Endo/Other    Renal/GU Renal InsufficiencyRenal disease     Musculoskeletal   Abdominal   Peds  Hematology   Anesthesia Other Findings   Reproductive/Obstetrics                             Anesthesia Physical Anesthesia Plan  ASA: III  Anesthesia Plan: General   Post-op Pain Management:  Regional for Post-op pain   Induction: Intravenous  Airway Management Planned: LMA  Additional Equipment:   Intra-op Plan:   Post-operative Plan:   Informed Consent: I have reviewed the patients History and Physical, chart, labs and discussed the procedure including the risks, benefits and alternatives for the proposed anesthesia with the patient or authorized representative who has indicated his/her understanding and acceptance.     Plan Discussed with: CRNA, Anesthesiologist and Surgeon  Anesthesia Plan Comments:         Anesthesia Quick Evaluation

## 2016-03-11 NOTE — Interval H&P Note (Signed)
History and Physical Interval Note: no change in H and P  03/11/2016 1:10 PM  Gerald Thompson  has presented today for surgery, with the diagnosis of bilateral inguinal hernia  The various methods of treatment have been discussed with the patient and family. After consideration of risks, benefits and other options for treatment, the patient has consented to  Procedure(s): OPEN BILATERAL INGUINAL HERNIA REPAIR (Bilateral) INSERTION OF MESH (Bilateral) as a surgical intervention .  The patient's history has been reviewed, patient examined, no change in status, stable for surgery.  I have reviewed the patient's chart and labs.  Questions were answered to the patient's satisfaction.     Alric Geise A

## 2016-03-11 NOTE — Transfer of Care (Signed)
Immediate Anesthesia Transfer of Care Note  Patient: Gerald Thompson  Procedure(s) Performed: Procedure(s): OPEN BILATERAL INGUINAL HERNIA REPAIR (Bilateral) INSERTION OF MESH (Bilateral)  Patient Location: PACU  Anesthesia Type:GA combined with regional for post-op pain  Level of Consciousness: sedated  Airway & Oxygen Therapy: Patient Spontanous Breathing and Patient connected to face mask oxygen  Post-op Assessment: Report given to RN and Post -op Vital signs reviewed and stable  Post vital signs: Reviewed and stable  Last Vitals:  Vitals:   03/11/16 1327 03/11/16 1330  BP: (!) 156/92   Pulse: 76 78  Resp: 14 15  Temp:      Last Pain:  Vitals:   03/11/16 1135  TempSrc: Oral      Patients Stated Pain Goal: 4 ([id number])  Complications: No apparent anesthesia complications

## 2016-03-11 NOTE — Progress Notes (Signed)
AssistedDr. Hodierne with right, left, ultrasound guided, transabdominal plane block. Side rails up, monitors on throughout procedure. See vital signs in flow sheet. Tolerated Procedure well.  

## 2016-03-11 NOTE — Anesthesia Postprocedure Evaluation (Signed)
Anesthesia Post Note  Patient: Gerald Thompson  Procedure(s) Performed: Procedure(s) (LRB): OPEN BILATERAL INGUINAL HERNIA REPAIR (Bilateral) INSERTION OF MESH (Bilateral)  Patient location during evaluation: PACU Anesthesia Type: General Level of consciousness: awake and alert and patient cooperative Pain management: pain level controlled Vital Signs Assessment: post-procedure vital signs reviewed and stable Respiratory status: spontaneous breathing and respiratory function stable Cardiovascular status: stable Anesthetic complications: no       Last Vitals:  Vitals:   03/11/16 1600 03/11/16 1615  BP: (!) 158/106 (!) 168/106  Pulse: 68 66  Resp: 13 13  Temp:      Last Pain:  Vitals:   03/11/16 1545  TempSrc:   PainSc: 0-No pain                 Court Gracia S

## 2016-03-12 ENCOUNTER — Encounter (HOSPITAL_COMMUNITY): Payer: Self-pay | Admitting: Surgery

## 2016-03-21 ENCOUNTER — Encounter (HOSPITAL_COMMUNITY): Payer: Self-pay | Admitting: Emergency Medicine

## 2016-03-21 ENCOUNTER — Emergency Department (HOSPITAL_COMMUNITY)
Admission: EM | Admit: 2016-03-21 | Discharge: 2016-03-21 | Disposition: A | Discharge status: Home / Self Care | Payer: BLUE CROSS/BLUE SHIELD | Attending: Emergency Medicine | Admitting: Emergency Medicine

## 2016-03-21 DIAGNOSIS — T83098A Other mechanical complication of other indwelling urethral catheter, initial encounter: Secondary | ICD-10-CM | POA: Insufficient documentation

## 2016-03-21 DIAGNOSIS — Y732 Prosthetic and other implants, materials and accessory gastroenterology and urology devices associated with adverse incidents: Secondary | ICD-10-CM | POA: Insufficient documentation

## 2016-03-21 DIAGNOSIS — Z79899 Other long term (current) drug therapy: Secondary | ICD-10-CM | POA: Diagnosis not present

## 2016-03-21 DIAGNOSIS — T83090A Other mechanical complication of cystostomy catheter, initial encounter: Secondary | ICD-10-CM

## 2016-03-21 DIAGNOSIS — I129 Hypertensive chronic kidney disease with stage 1 through stage 4 chronic kidney disease, or unspecified chronic kidney disease: Secondary | ICD-10-CM | POA: Insufficient documentation

## 2016-03-21 DIAGNOSIS — Z87891 Personal history of nicotine dependence: Secondary | ICD-10-CM | POA: Insufficient documentation

## 2016-03-21 DIAGNOSIS — N184 Chronic kidney disease, stage 4 (severe): Secondary | ICD-10-CM | POA: Diagnosis not present

## 2016-03-21 NOTE — ED Triage Notes (Signed)
Pt is c/o urinary retention  Pt states he has a suprapubic catheter that is not draining

## 2016-03-21 NOTE — ED Provider Notes (Signed)
[facility] DEPT Provider Note   CSN: [id number] Arrival date & time: 03/21/16  [id number]     History   Chief Complaint Chief Complaint  Patient presents with  . Urinary Retention    HPI CAMMIE FERRANDO is a 71 y.o. male.  He has had an indwelling suprapubic catheter since August. It has not drained since 3:30 AM. He is complaining of being very uncomfortable. He comes in because he feels the catheter needs to be changed. He denies fever, chills, sweats. He denies nausea or vomiting.   The history is provided by the patient.    Past Medical History:  Diagnosis Date  . GERD (gastroesophageal reflux disease)   . Hypertension   . Renal disease 2017   stage IV renal disease    Patient Active Problem List   Diagnosis Date Noted  . Abdominal pain 01/04/2016  . 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 CKD (chronic kidney disease) stage 3, GFR 30-59 ml/min 01/04/2016  . Nausea & vomiting 01/04/2016  . Pancreatic lesion 01/04/2016    Past Surgical History:  Procedure Laterality Date  . INGUINAL HERNIA REPAIR Bilateral 03/11/2016   Procedure: OPEN BILATERAL INGUINAL HERNIA REPAIR;  Surgeon: Coralie Keens, MD;  Location: [facility] ORS;  Service: General;  Laterality: Bilateral;  . INSERTION OF MESH Bilateral 03/11/2016   Procedure: INSERTION OF MESH;  Surgeon: Coralie Keens, MD;  Location: [facility] ORS;  Service: General;  Laterality: Bilateral;  . SUPRAPUBIC CATHETER PLACEMENT         Home Medications    Prior to Admission medications   Medication Sig Start Date End Date Taking? Authorizing Provider  amLODipine (NORVASC) 2.5 MG tablet Take 2.5 mg by mouth daily.     Historical Provider, MD  buPROPion (WELLBUTRIN SR) 150 MG 12 hr tablet Take 150 mg by mouth 2 (two) times daily as needed (depression/mood).     Historical Provider, MD  HYDROcodone-acetaminophen (NORCO/VICODIN) 5-325 MG tablet Take 1-2 tablets by mouth every 4 (four) hours as needed for moderate pain or  severe pain. 03/11/16   Coralie Keens, MD  mirtazapine (REMERON) 15 MG tablet Take 7.5 mg by mouth at bedtime as needed (appetite/sleep).    Historical Provider, MD  Multiple Vitamin (MULTI-VITAMINS) TABS Take 2 tablets by mouth every morning.     Historical Provider, MD  oxybutynin (DITROPAN XL) 10 MG 24 hr tablet Take 10 mg by mouth daily.  04/21/15   Historical Provider, MD  ranitidine (ZANTAC) 150 MG capsule Take 150 mg by mouth daily as needed for heartburn.     Historical Provider, MD    Family History History reviewed. No pertinent family history.  Social History Social History  Substance Use Topics  . Smoking status: Former Research scientist (life sciences)  . Smokeless tobacco: Never Used  . Alcohol use No     Allergies   Patient has no known allergies.   Review of Systems Review of Systems  All other systems reviewed and are negative.    Physical Exam Updated Vital Signs Pulse 96   Temp 97.4 F (36.3 C) (Oral)   Resp 22   SpO2 100%   Physical Exam  Nursing note and vitals reviewed.  71 year old male, who is quite uncomfortable and restless, but in no acute distress. Vital signs are significant for tachypnea. Oxygen saturation is 100%, which is normal. Head is normocephalic and atraumatic. PERRLA, EOMI. Oropharynx is clear. Neck is nontender and supple without adenopathy or JVD. Back is nontender and there is no CVA  tenderness. Lungs are clear without rales, wheezes, or rhonchi. Chest is nontender. Heart has regular rate and rhythm without murmur. Abdomen is soft, flat, nontender without masses or hepatosplenomegaly and peristalsis is normoactive. Suprapubic catheter is in place. Bladder is distended. Extremities have no cyanosis or edema, full range of motion is present. Skin is warm and dry without rash. Neurologic: Mental status is normal, cranial nerves are intact, there are no motor or sensory deficits.  ED Treatments / Results  Labs (all labs ordered are listed, but only  abnormal results are displayed) Labs Reviewed  URINALYSIS, ROUTINE W REFLEX MICROSCOPIC    Procedures Procedures (including critical care time) Suprapubic catheter replacement Indication: Obstructed suprapubic catheter Before procedure was done, a timeout was performed. Patient identified verbally and with hospital supplied name band The balloon and the existing suprapubic catheter was deflated and catheter removed. He spontaneously was expelling urine through the suprapubic stoma. 85 French Foley catheter was inserted through the stoma without difficulty and was promptly draining clear urine. Patient tolerated procedure well.  Medications Ordered in ED Medications - No data to display   Initial Impression / Assessment and Plan / ED Course  I have reviewed the triage vital signs and the nursing notes.  Pertinent lab results that were available during my care of the patient were reviewed by me and considered in my medical decision making (see chart for details).  Obstructed suprapubic catheter. Catheter is removed and a new catheter is placed and is draining urine which is clear. He is discharged with instructions to resume routine catheter care.  Final Clinical Impressions(s) / ED Diagnoses   Final diagnoses:  Obstructed suprapubic catheter, initial encounter [facility])    New Prescriptions New Prescriptions   No medications on file     Delora Fuel, MD [id number] [phone]

## 2016-03-21 NOTE — Discharge Instructions (Signed)
Resume your usual catheter care program.

## 2016-04-06 ENCOUNTER — Other Ambulatory Visit: Payer: Self-pay | Admitting: Surgery

## 2016-04-10 ENCOUNTER — Emergency Department (HOSPITAL_COMMUNITY)
Admission: EM | Admit: 2016-04-10 | Discharge: 2016-04-10 | Disposition: A | Discharge status: Home / Self Care | Payer: BLUE CROSS/BLUE SHIELD | Attending: Emergency Medicine | Admitting: Emergency Medicine

## 2016-04-10 ENCOUNTER — Encounter (HOSPITAL_COMMUNITY): Payer: Self-pay | Admitting: Emergency Medicine

## 2016-04-10 DIAGNOSIS — N184 Chronic kidney disease, stage 4 (severe): Secondary | ICD-10-CM | POA: Insufficient documentation

## 2016-04-10 DIAGNOSIS — Y69 Unspecified misadventure during surgical and medical care: Secondary | ICD-10-CM | POA: Insufficient documentation

## 2016-04-10 DIAGNOSIS — Z87891 Personal history of nicotine dependence: Secondary | ICD-10-CM | POA: Insufficient documentation

## 2016-04-10 DIAGNOSIS — I129 Hypertensive chronic kidney disease with stage 1 through stage 4 chronic kidney disease, or unspecified chronic kidney disease: Secondary | ICD-10-CM | POA: Diagnosis not present

## 2016-04-10 DIAGNOSIS — T83090A Other mechanical complication of cystostomy catheter, initial encounter: Secondary | ICD-10-CM

## 2016-04-10 DIAGNOSIS — T83098A Other mechanical complication of other indwelling urethral catheter, initial encounter: Secondary | ICD-10-CM | POA: Insufficient documentation

## 2016-04-10 NOTE — Discharge Instructions (Signed)
Watch for fevers or signs of recurrent obstructions.

## 2016-04-10 NOTE — ED Notes (Signed)
Bed: WA21 Expected date:  Expected time:  Means of arrival:  Comments: 

## 2016-04-10 NOTE — ED Provider Notes (Signed)
[place] DEPT Provider Note   CSN: [id number] Arrival date & time: 04/10/16  1201     History   Chief Complaint Chief Complaint  Patient presents with  . Urinary Retention    Catheter Problem    HPI Gerald Thompson is a 71 y.o. male.  HPI Patient presents with chronic suprapubic catheter. States that it felt earlier today like it  was getting clogged. Drainage had stopped. He has had obstructions in the past. Catheter has been in for the last couple weeks but has had this suprapubic for several months. No fevers no other abdominal pain. No nausea.   Past Medical History:  Diagnosis Date  . GERD (gastroesophageal reflux disease)   . Hypertension   . Renal disease 2017   stage IV renal disease    Patient Active Problem List   Diagnosis Date Noted  . Abdominal pain 01/04/2016  . Chronic suprapubic catheter ([place]) 01/04/2016  . CKD (chronic kidney disease) stage 3, GFR 30-59 ml/min 01/04/2016  . Nausea & vomiting 01/04/2016  . Pancreatic lesion 01/04/2016    Past Surgical History:  Procedure Laterality Date  . INGUINAL HERNIA REPAIR Bilateral 03/11/2016   Procedure: OPEN BILATERAL INGUINAL HERNIA REPAIR;  Surgeon: Coralie Keens, MD;  Location: [facility];  Service: General;  Laterality: Bilateral;  . INSERTION OF MESH Bilateral 03/11/2016   Procedure: INSERTION OF MESH;  Surgeon: Coralie Keens, MD;  Location: [facility];  Service: General;  Laterality: Bilateral;  . SUPRAPUBIC CATHETER PLACEMENT         Home Medications    Prior to Admission medications   Medication Sig Start Date End Date Taking? Authorizing Provider  amLODipine (NORVASC) 2.5 MG tablet Take 2.5 mg by mouth daily.    Yes Historical Provider, MD  buPROPion (WELLBUTRIN SR) 150 MG 12 hr tablet Take 150 mg by mouth 2 (two) times daily as needed (depression/mood).    Yes Historical Provider, MD  mirtazapine (REMERON) 15 MG tablet Take 7.5 mg by mouth at bedtime as needed (appetite/sleep).   Yes  Historical Provider, MD  Multiple Vitamin (MULTI-VITAMINS) TABS Take 2 tablets by mouth every morning.    Yes Historical Provider, MD  oxybutynin (DITROPAN XL) 10 MG 24 hr tablet Take 10 mg by mouth daily.  04/21/15  Yes Historical Provider, MD  ranitidine (ZANTAC) 150 MG capsule Take 150 mg by mouth daily as needed for heartburn.    Yes Historical Provider, MD  HYDROcodone-acetaminophen (NORCO/VICODIN) 5-325 MG tablet Take 1-2 tablets by mouth every 4 (four) hours as needed for moderate pain or severe pain. Patient not taking: Reported on 04/10/2016 03/11/16   Coralie Keens, MD    Family History History reviewed. No pertinent family history.  Social History Social History  Substance Use Topics  . Smoking status: Former Research scientist (life sciences)  . Smokeless tobacco: Never Used  . Alcohol use No     Allergies   Patient has no known allergies.   Review of Systems Review of Systems  Constitutional: Negative for appetite change and fever.  Respiratory: Negative for shortness of breath.   Gastrointestinal: Positive for abdominal pain.  Genitourinary: Positive for difficulty urinating. Negative for discharge, hematuria and penile swelling.  Musculoskeletal: Negative for arthralgias.  Skin: Negative for wound.  Hematological: Negative for adenopathy.  Psychiatric/Behavioral: Negative for confusion.     Physical Exam Updated Vital Signs BP (!) 164/103 (BP Location: Right Arm)   Pulse 93   Temp 98.1 F (36.7 C) (Oral)   Resp 20   Ht  6\' 1"  (1.854 m)   Wt 165 lb (74.8 kg)   SpO2 97%   BMI 21.77 kg/m   Physical Exam  Constitutional: He appears well-developed.  HENT:  Head: Atraumatic.  Eyes: EOM are normal.  Neck: Neck supple.  Cardiovascular: Normal rate.   Abdominal: Soft. There is tenderness.  Suprapubic catheter in place. Healing scars from somewhat recent lower abdominal bilateral hernia repairs. Fullness and some tenderness around the suprapubic catheter.  Musculoskeletal: He  exhibits no edema.  Neurological: He is alert.     ED Treatments / Results  Labs (all labs ordered are listed, but only abnormal results are displayed) Labs Reviewed - No data to display  EKG  EKG Interpretation None       Radiology No results found.  Procedures Procedures (including critical care time)  Medications Ordered in ED Medications - No data to display   Initial Impression / Assessment and Plan / ED Course  I have reviewed the triage vital signs and the nursing notes.  Pertinent labs & imaging results that were available during my care of the patient were reviewed by me and considered in my medical decision making (see chart for details).     Patient was obstructed suprapubic catheter. Change to the ER by myself. Patient feels much better. Initially his tube was unable to flush and it was changed out. Will discharge home to follow-up with urology.  Suprapubic catheter procedure note: Patient's existing suprapubic catheter was attempted be flushed and could not. Then the catheter and the area around it were cleaned with Betadine. The fluid was removed from the balloon. Then under sterile conditions some lubrication was applied to a new 20 [place] catheter. The old catheter was removed and a new one was quickly placed to minimize the loss of urine since he was obstructed. Had good flow of cloudy yellow urine. There is some sediment. Patient felt much better. Foley bag was attached. 10 mL of saline injected in the balloon of the catheter.    Final Clinical Impressions(s) / ED Diagnoses   Final diagnoses:  Obstructed suprapubic catheter, initial encounter [facility])    New Prescriptions Discharge Medication List as of 04/10/2016  1:09 PM       Davonna Belling, MD 04/10/16 1642

## 2016-04-10 NOTE — ED Triage Notes (Signed)
Per pt, he has a chronic foley catheter that was due to be changed next week at [facility]. However, pt has not had any output and tried to flush his catheter without any success.  Pt not in obvious distress and denies pain.

## 2016-04-12 ENCOUNTER — Other Ambulatory Visit: Payer: Self-pay

## 2016-04-12 NOTE — Patient Outreach (Signed)
[place] [facility]) Care Management  04/12/2016  Gerald Thompson 1945/10/05 [id number]   Telephone call to patient for ED utilization screen.  No answer.  HIPAA compliant voice message left.  Plan: RN Health Coach will attempt patient again within 10 business days.    Jone Baseman, RN, MSN [place] [phone]

## 2016-04-14 ENCOUNTER — Other Ambulatory Visit: Payer: Self-pay

## 2016-04-14 NOTE — Patient Outreach (Signed)
[place] [facility]) Care Management  04/14/2016  GARWIN STADELMAN 05-17-1945 [id number]   Return call to patient for screening for ED Utilization.  No answer. HIPAA compliant voice message left.  Plan: RN Health Coach will attempt patient again in the next 10 business days.    Jone Baseman, RN, MSN [place] [phone]

## 2016-04-14 NOTE — Patient Outreach (Signed)
[place] [facility]) Care Management  04/14/2016  BANNING DEMILLE [birth year] [id number]   Referral Date: 04-12-16  Referral Source: ED Utilization  Referral Reason: ED Utilization  Outreach Attempt: Patient returned call Social: Patient lives with wife. Patient independent with ADL's. Patient has living will and health care power of attorney.    Conditions: Patient reports he has not seen Dr. Elder Cyphers in about two years.  He states he sees Animator.  Patient has a suprapubic catheter from hydronephrosis.  Patient has problems with catheter clogging.  Patient admits to not flushing, as he should, which is causing him frequent ED visits and that he has a copay that has him thinking otherwise.  Patient advised with flushing more frequently will possibly help the situation.  He verbalized understanding.  Patient otherwise says he manages well.  He admits to hypertension and some Post Traumatic Stress Disorder from his [place] experience.    Medications: Patient has no questions or concerns about his medications.  Services: Patient has declined services at this time.    Plan:  RN Health Coach will send letter and brochure for future reference.    Jone Baseman, RN, MSN [place] [phone]

## 2016-04-20 DIAGNOSIS — Z9359 Other cystostomy status: Secondary | ICD-10-CM | POA: Diagnosis not present

## 2016-05-08 ENCOUNTER — Emergency Department (HOSPITAL_COMMUNITY)
Admission: EM | Admit: 2016-05-08 | Discharge: 2016-05-08 | Disposition: A | Discharge status: Home / Self Care | Payer: BLUE CROSS/BLUE SHIELD | Attending: Emergency Medicine | Admitting: Emergency Medicine

## 2016-05-08 ENCOUNTER — Encounter (HOSPITAL_COMMUNITY): Payer: Self-pay | Admitting: Emergency Medicine

## 2016-05-08 DIAGNOSIS — Z79899 Other long term (current) drug therapy: Secondary | ICD-10-CM | POA: Insufficient documentation

## 2016-05-08 DIAGNOSIS — Z87891 Personal history of nicotine dependence: Secondary | ICD-10-CM | POA: Diagnosis not present

## 2016-05-08 DIAGNOSIS — Y732 Prosthetic and other implants, materials and accessory gastroenterology and urology devices associated with adverse incidents: Secondary | ICD-10-CM | POA: Diagnosis not present

## 2016-05-08 DIAGNOSIS — N184 Chronic kidney disease, stage 4 (severe): Secondary | ICD-10-CM | POA: Insufficient documentation

## 2016-05-08 DIAGNOSIS — I129 Hypertensive chronic kidney disease with stage 1 through stage 4 chronic kidney disease, or unspecified chronic kidney disease: Secondary | ICD-10-CM | POA: Diagnosis not present

## 2016-05-08 DIAGNOSIS — T83098A Other mechanical complication of other indwelling urethral catheter, initial encounter: Secondary | ICD-10-CM | POA: Insufficient documentation

## 2016-05-08 DIAGNOSIS — T83090A Other mechanical complication of cystostomy catheter, initial encounter: Secondary | ICD-10-CM

## 2016-05-08 NOTE — ED Notes (Signed)
Instructed patient to get undressed and put gown on.  Pt refused.

## 2016-05-08 NOTE — ED Provider Notes (Signed)
[place] DEPT Provider Note   CSN: [id number] Arrival date & time: 05/08/16  1453     History   Chief Complaint Chief Complaint  Patient presents with  . Catheter Blockage    HPI Gerald Thompson is a 71 y.o. male with pertinent pmh of renal disease, HTN, chronic suprapubic catheter presents to ED reporting catheter is clogged, stopped draining at noon today.  Patient having strong urge to urinate.  Patient has had obstructions in the past that have required replacement in ED (last 04/10/16).  Patient has monthly appointment with urology for cath changes, has next appointment in one week.  Patient denies fevers, changes in color of urine, frequency, back pain, nausea.  Patient has lower abdominal pain but he contributes this to recent hernia surgeries.    HPI  Past Medical History:  Diagnosis Date  . GERD (gastroesophageal reflux disease)   . Hypertension   . Renal disease 2017   stage IV renal disease    Patient Active Problem List   Diagnosis Date Noted  . Abdominal pain 01/04/2016  . Chronic suprapubic catheter ([place]) 01/04/2016  . CKD (chronic kidney disease) stage 3, GFR 30-59 ml/min 01/04/2016  . Nausea & vomiting 01/04/2016  . Pancreatic lesion 01/04/2016    Past Surgical History:  Procedure Laterality Date  . INGUINAL HERNIA REPAIR Bilateral 03/11/2016   Procedure: OPEN BILATERAL INGUINAL HERNIA REPAIR;  Surgeon: Coralie Keens, MD;  Location: [facility];  Service: General;  Laterality: Bilateral;  . INSERTION OF MESH Bilateral 03/11/2016   Procedure: INSERTION OF MESH;  Surgeon: Coralie Keens, MD;  Location: [facility];  Service: General;  Laterality: Bilateral;  . SUPRAPUBIC CATHETER PLACEMENT         Home Medications    Prior to Admission medications   Medication Sig Start Date End Date Taking? Authorizing Provider  amLODipine (NORVASC) 2.5 MG tablet Take 2.5 mg by mouth daily.     Historical Provider, MD  buPROPion (WELLBUTRIN SR) 150 MG 12 hr tablet  Take 150 mg by mouth 2 (two) times daily as needed (depression/mood).     Historical Provider, MD  HYDROcodone-acetaminophen (NORCO/VICODIN) 5-325 MG tablet Take 1-2 tablets by mouth every 4 (four) hours as needed for moderate pain or severe pain. Patient not taking: Reported on 04/10/2016 03/11/16   Coralie Keens, MD  mirtazapine (REMERON) 15 MG tablet Take 7.5 mg by mouth at bedtime as needed (appetite/sleep).    Historical Provider, MD  Multiple Vitamin (MULTI-VITAMINS) TABS Take 2 tablets by mouth every morning.     Historical Provider, MD  oxybutynin (DITROPAN XL) 10 MG 24 hr tablet Take 10 mg by mouth daily.  04/21/15   Historical Provider, MD  ranitidine (ZANTAC) 150 MG capsule Take 150 mg by mouth daily as needed for heartburn.     Historical Provider, MD    Family History No family history on file.  Social History Social History  Substance Use Topics  . Smoking status: Former Research scientist (life sciences)  . Smokeless tobacco: Never Used  . Alcohol use No     Allergies   Patient has no known allergies.   Review of Systems Review of Systems  Constitutional: Negative for fever.  Respiratory: Negative for shortness of breath.   Cardiovascular: Negative for chest pain.  Gastrointestinal: Positive for abdominal pain. Negative for constipation, diarrhea, nausea and vomiting.  Genitourinary: Positive for difficulty urinating. Negative for decreased urine volume, dysuria, flank pain, frequency, hematuria and urgency.  Musculoskeletal: Negative for back pain.  Skin: Negative for  color change and rash.  Neurological: Negative for dizziness, light-headedness and headaches.  Hematological: Negative.   Psychiatric/Behavioral: Negative.      Physical Exam Updated Vital Signs BP 154/86 (BP Location: Right Arm)   Pulse 99   Temp 98.3 F (36.8 C) (Oral)   Resp 18   Ht 6' 1"  (1.854 m)   Wt 74.8 kg   SpO2 100%   BMI 21.77 kg/m   Physical Exam  Constitutional: He is oriented to person, place, and  time. He appears well-developed and well-nourished. No distress.  HENT:  Head: Normocephalic and atraumatic.  Eyes: Conjunctivae and EOM are normal. Pupils are equal, round, and reactive to light.  Neck: Normal range of motion.  Cardiovascular: Normal rate, regular rhythm, normal heart sounds and intact distal pulses.   No murmur heard. Pulmonary/Chest: Effort normal and breath sounds normal. No respiratory distress. He has no wheezes. He has no rales.  Abdominal: Soft. Bowel sounds are normal. He exhibits no distension. There is no tenderness.  Suprapubic catheter in place without surrounding erythema, edema, discharge or skin break down.  Musculoskeletal: Normal range of motion. He exhibits no deformity.  Neurological: He is alert and oriented to person, place, and time.  Skin: Skin is warm and dry. Capillary refill takes less than 2 seconds.  Psychiatric: He has a normal mood and affect. His behavior is normal. Judgment and thought content normal.  Nursing note and vitals reviewed.    ED Treatments / Results  Labs (all labs ordered are listed, but only abnormal results are displayed) Labs Reviewed - No data to display  EKG  EKG Interpretation None       Radiology No results found.  Procedures Procedures (including critical care time)  Suprapubic catheter replacement Indication: obstructed chronic suprapubic catheter Consent: verbal obtained from patient Equipment: 20 fr suprapubic catheter kit Anesthesia: none Procedure: Suprapubic stoma and catheter were thoroughly sterilized with Betadine.  Fluid in the balloon was aspirated prior to removal.  Lubrication was applied to the new 96f cathter.  The old catheter was removed without resistance and the new cathter was quickly placed without meeting resistance.  There was minimal urine loss through suprapubic stoma during catheter exchange.  Balloon was inflated with 10 mL of normal saline. Patient had spontaneous urine flow  upon insertion of new suprapubic catheter. Urine was clear without gross hematuria.  Patient reported immediate relief of urine urgency.  Foley bag was attached and hung at bedside.   Nurse attached new leg bag prior to discharge.   Medications Ordered in ED Medications - No data to display   Initial Impression / Assessment and Plan / ED Course  I have reviewed the triage vital signs and the nursing notes.  Pertinent labs & imaging results that were available during my care of the patient were reviewed by me and considered in my medical decision making (see chart for details).   Patient with obstructed, chronic suprapubic catheter that was initially placed in august 2017.  Patient reports catheter stopped draining appropriately at noon today. Catheter was changed in the ER by myself with assist of Dr. SAshok Cordiawho was present during procedure. Patient tolerated cath change without difficulty, had spontaneous urine drainage upon insertion of new catheter without difficulty. Urine clear.  No surrounding skin changes that would suggest skin infection or cellulitis.  Patient was discharge, has f/u with urology in 1 week.   Final Clinical Impressions(s) / ED Diagnoses   Final diagnoses:  Obstructed suprapubic catheter, initial  encounter [facility])    New Prescriptions Discharge Medication List as of 05/08/2016  4:09 PM       Kinnie Feil, PA-C 05/08/16 [place], MD 05/09/16 [phone]

## 2016-05-08 NOTE — ED Triage Notes (Signed)
Pt has chronic pubic catheter use.  States his catheter is clogged and is not flushing properly.  Denies pain at this time.  Due to have it changed in a week. Was here a month ago for same and had catheter changed.

## 2016-05-08 NOTE — ED Notes (Signed)
Pt arrived with distended bladder, unable to irrigate cath. PA and MD in to change cath, replace with #20 F, drainage with cloudy appearance noted in return.

## 2016-05-08 NOTE — ED Notes (Signed)
Bladder scan: 253mLs

## 2016-05-13 DIAGNOSIS — N2889 Other specified disorders of kidney and ureter: Secondary | ICD-10-CM | POA: Diagnosis not present

## 2016-05-18 DIAGNOSIS — Z9359 Other cystostomy status: Secondary | ICD-10-CM | POA: Diagnosis not present

## 2016-06-15 DIAGNOSIS — R339 Retention of urine, unspecified: Secondary | ICD-10-CM | POA: Diagnosis not present

## 2016-07-09 DIAGNOSIS — R339 Retention of urine, unspecified: Secondary | ICD-10-CM | POA: Diagnosis not present

## 2016-08-09 DIAGNOSIS — R339 Retention of urine, unspecified: Secondary | ICD-10-CM | POA: Diagnosis not present

## 2016-08-26 DIAGNOSIS — N319 Neuromuscular dysfunction of bladder, unspecified: Secondary | ICD-10-CM | POA: Diagnosis not present

## 2016-08-26 DIAGNOSIS — N184 Chronic kidney disease, stage 4 (severe): Secondary | ICD-10-CM | POA: Diagnosis not present

## 2016-08-26 DIAGNOSIS — N401 Enlarged prostate with lower urinary tract symptoms: Secondary | ICD-10-CM | POA: Diagnosis not present

## 2016-08-26 DIAGNOSIS — R338 Other retention of urine: Secondary | ICD-10-CM | POA: Diagnosis not present

## 2016-09-30 DIAGNOSIS — R339 Retention of urine, unspecified: Secondary | ICD-10-CM | POA: Diagnosis not present

## 2016-10-04 DIAGNOSIS — M21611 Bunion of right foot: Secondary | ICD-10-CM | POA: Diagnosis not present

## 2016-10-04 DIAGNOSIS — Z86010 Personal history of colonic polyps: Secondary | ICD-10-CM | POA: Diagnosis not present

## 2016-10-04 DIAGNOSIS — Z800 Family history of malignant neoplasm of digestive organs: Secondary | ICD-10-CM | POA: Diagnosis not present

## 2016-11-09 DIAGNOSIS — R339 Retention of urine, unspecified: Secondary | ICD-10-CM | POA: Diagnosis not present

## 2016-11-16 DIAGNOSIS — M21611 Bunion of right foot: Secondary | ICD-10-CM | POA: Diagnosis not present

## 2016-11-16 DIAGNOSIS — M2011 Hallux valgus (acquired), right foot: Secondary | ICD-10-CM | POA: Diagnosis not present

## 2017-10-07 IMAGING — CT CT RENAL STONE PROTOCOL
2 of 3 series · 14 of 46 positions shown, 16 images · non-contrast
Comparison: None.

CLINICAL DATA: 70-year-old male with a history of urinary retention
status post suprapubic catheter placement 3 weeks prior, presenting
with abdominal pain and decreased urine output in catheter bag.

EXAM:
CT ABDOMEN AND PELVIS WITHOUT CONTRAST
TECHNIQUE: Multidetector CT imaging of the abdomen and pelvis was performed
following the standard protocol without IV contrast.

[Series 3: coronal · coronal · 0.74mm/px · 3 of 148 slices shown]
[im 50/148  soft-tissue]
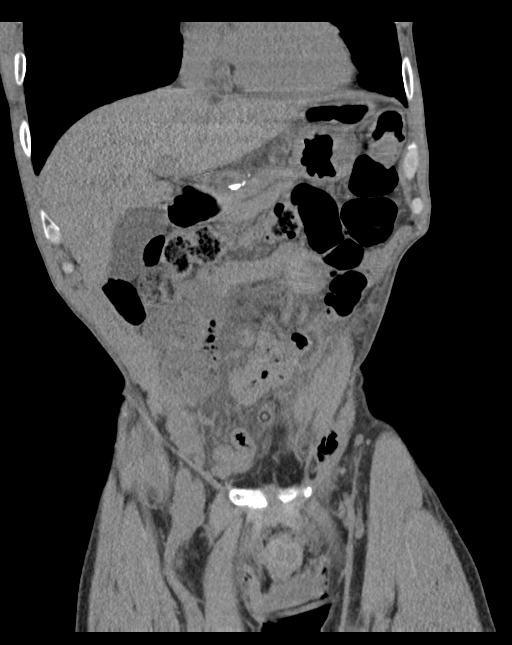
[im 66/148  soft-tissue]
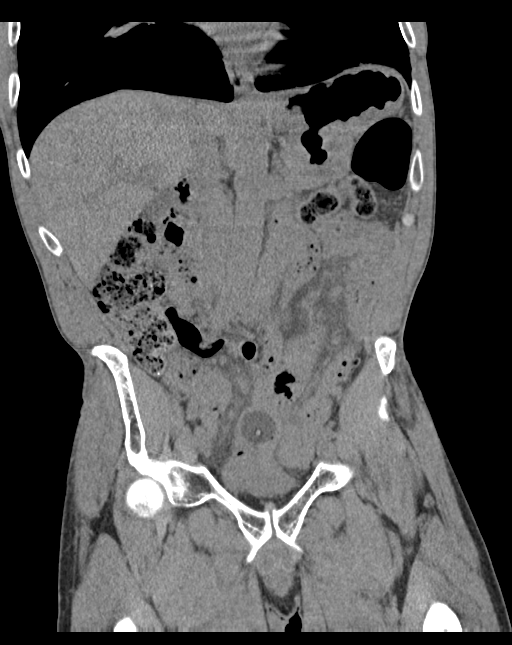
[im 82/148  soft-tissue]
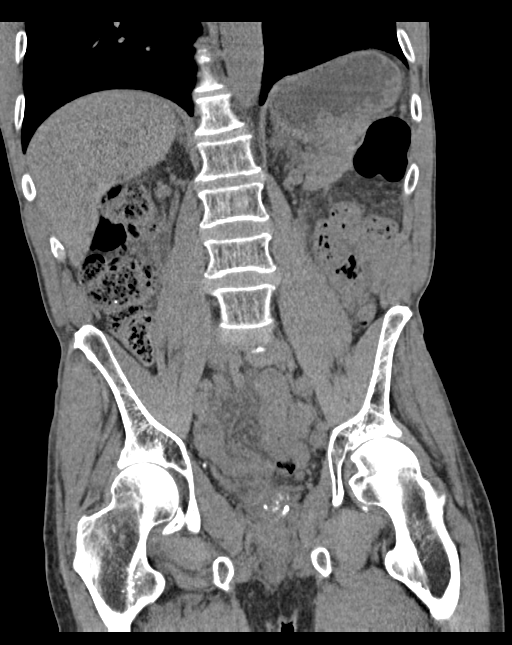

[Series 6: lung · axial · 0.85mm/px · z∈[-26,+90]mm · 11 of 68 slices shown, 13 images]
[im 5/68  soft-tissue]
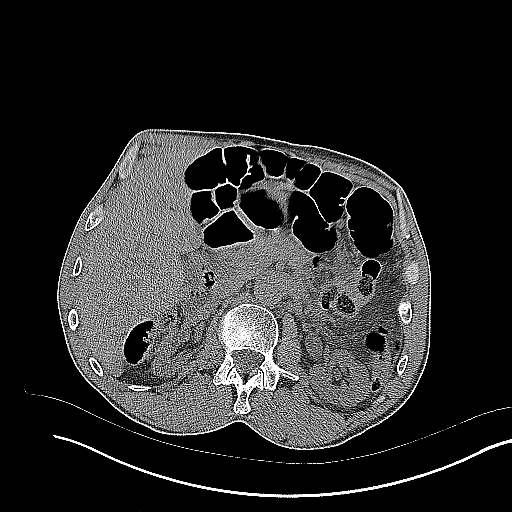
[im 5/68  bone]
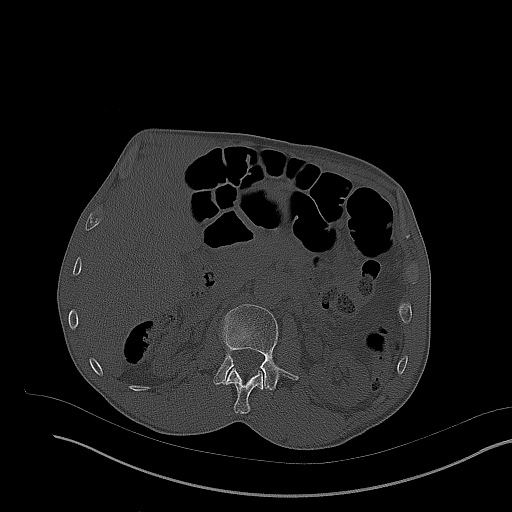
[im 11/68  soft-tissue]
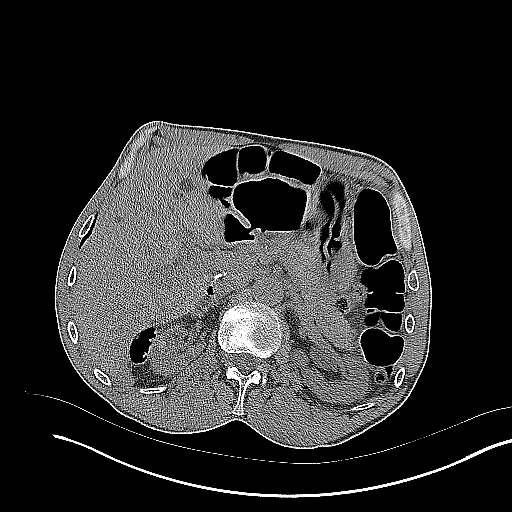
[im 16/68  soft-tissue]
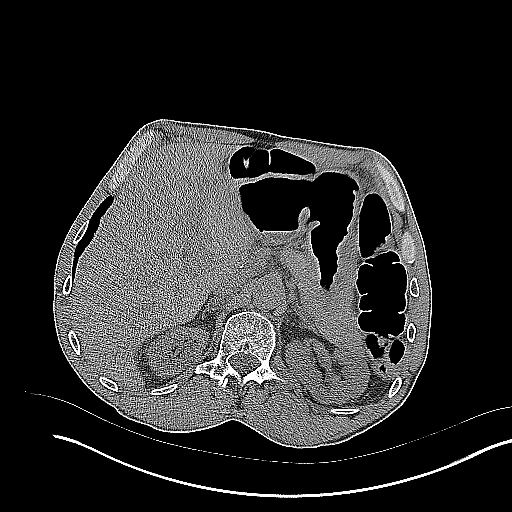
[im 22/68  soft-tissue]
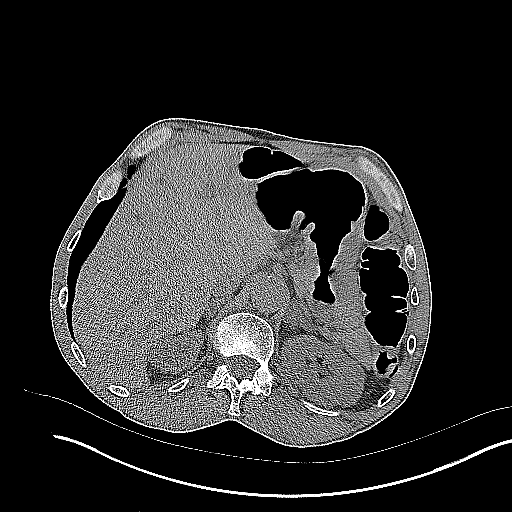
[im 29/68  soft-tissue]
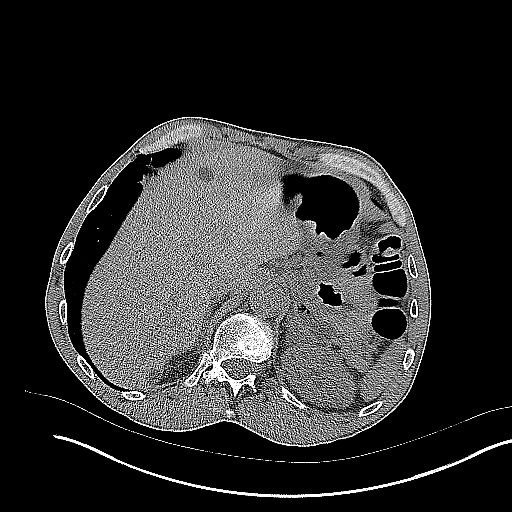
[im 35/68  soft-tissue]
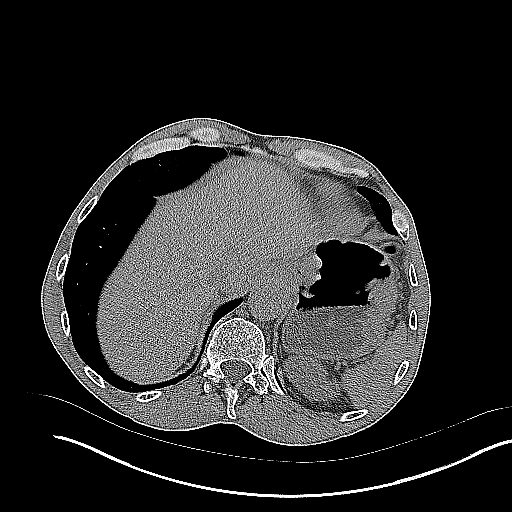
[im 39/68  soft-tissue]
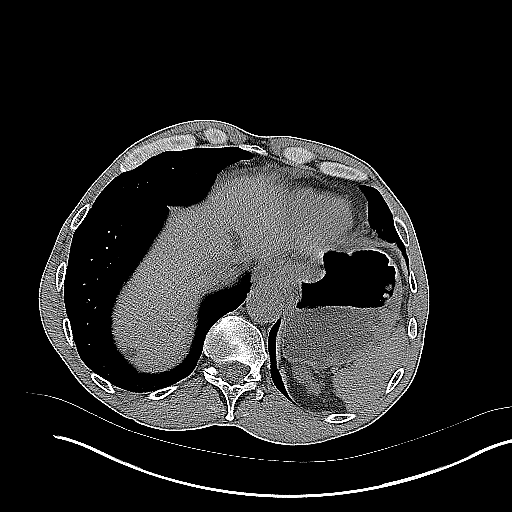
[im 46/68  soft-tissue]
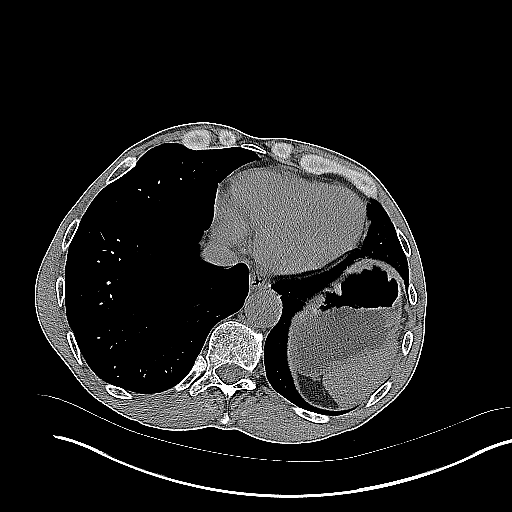
[im 52/68  soft-tissue]
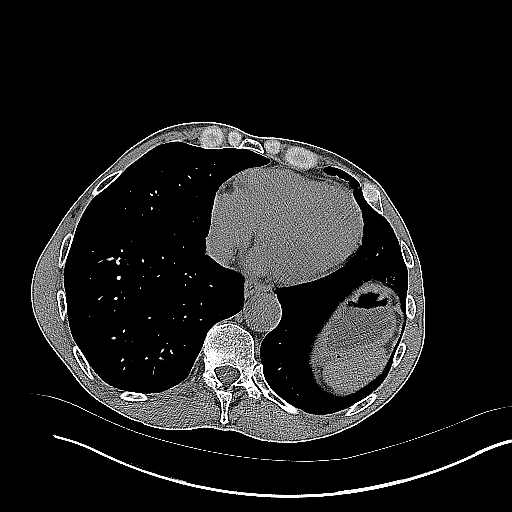
[im 52/68  bone]
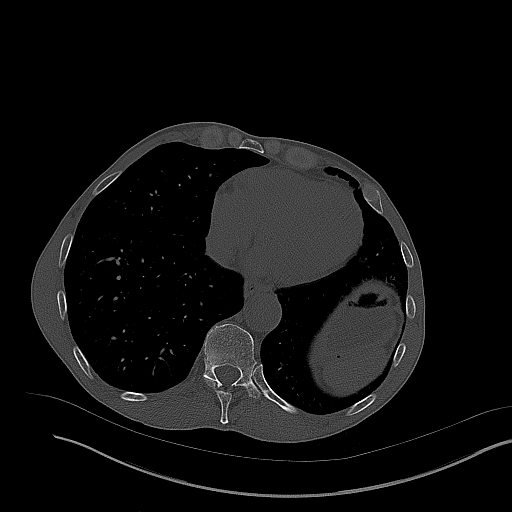
[im 57/68  soft-tissue]
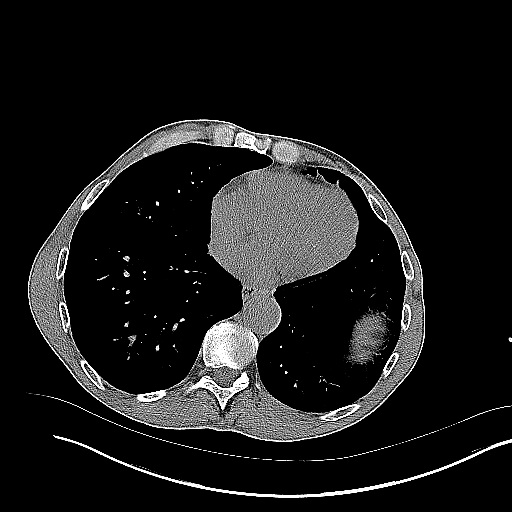
[im 63/68  soft-tissue]
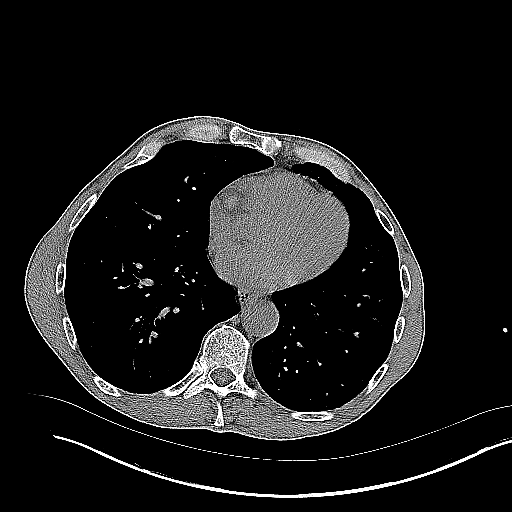

[14 of 46 positions shown; findings below may reference images not displayed]

FINDINGS: Lower chest: Centrilobular emphysema is present at the lung bases.
Subpleural 4 mm pulmonary nodule associated with the right major
fissure (series 6/ image 20). Nonspecific patchy subpleural
reticulation at both lung bases.

Hepatobiliary: Normal liver size. Small scattered simple liver cysts
measuring up to 1.8 cm in the anterior left liver lobe. Several
additional subcentimeter hypodense liver lesions scattered
throughout the liver, too small to characterize, which require no
further follow-up. Normal gallbladder with no radiopaque
cholelithiasis. No biliary ductal dilatation.

Pancreas: Low-attenuation 1.1 cm lesion in the posterior pancreatic
tail (series 2/ image 21). No additional pancreatic lesions. No main
pancreatic duct dilation.

Spleen: Normal size. No mass.

Adrenals/Urinary Tract: No discrete adrenal nodule. Bilateral renal
atrophy, asymmetrically severe on the right. No right
hydronephrosis. Fullness of the central left renal collecting system
without overt left hydronephrosis. Hyperdense 0.8 cm renal cortical
lesion in the interpolar right kidney. No additional contour
deforming renal lesions. No renal stones. Normal caliber ureters,
with no ureteral stones. Bladder is completely collapsed by
indwelling suprapubic catheter. Expected scattered gas in the
bladder lumen from instrumentation. No bladder stones. No definite
bladder wall thickening.

Stomach/Bowel: Grossly normal stomach. There are small bilateral
inguinal hernias, both of which contain portions of pelvic small
bowel loops. No small bowel dilatation or full caliber transition.
No small bowel wall thickening or pneumatosis. Appendix is not
discretely visualized. Normal large bowel with no diverticulosis,
large bowel wall thickening or pericolonic fat stranding.

Vascular/Lymphatic: Atherosclerotic nonaneurysmal abdominal aorta.
No pathologically enlarged lymph nodes in the abdomen or pelvis.

Reproductive: Top-normal size prostate with coarse nonspecific
internal prostatic calcifications.

Other: No pneumoperitoneum. Diffuse haziness of the mesenteric fat.
No focal fluid collections.

Musculoskeletal: No aggressive appearing focal osseous lesions.
Mild-to-moderate thoracolumbar spondylosis, most prominent at L5-S1.
IMPRESSION: 1. Bladder is collapsed by indwelling suprapubic catheter. Fullness
of the central left renal collecting system without overt left
hydronephrosis. No right hydronephrosis. Bilateral renal atrophy,
asymmetrically severe on the right.
2. Small bilateral inguinal hernias containing pelvic small bowel
loops. No evidence of small-bowel obstruction or ischemia.
3. Diffuse haziness of the mesenteric fat. No pneumoperitoneum. No
focal fluid collections. Findings could indicate an infectious or
inflammatory peritonitis.
4. **An incidental finding of potential clinical significance has
been found. Low-attenuation 1.1 cm posterior pancreatic tail lesion
without pancreatic duct dilation. Recommend follow up pre and post
contrast MRI abdomen/MRCP or pancreatic protocol CT in 2 years. This
recommendation follows ACR consensus guidelines: Management of
Incidental Pancreatic Cysts: A White Paper of the ACR Incidental
Findings Committee. [HOSPITAL] 1911;[DATE]. **
5. Hyperdense 0.8 cm interpolar right renal cortical lesion, too
small to characterize, no further workup recommended. This
recommendation follows ACR consensus guidelines: Management of the
Incidental Renal Mass on CT: A White Paper of the ACR Incidental
Findings Committee. [HOSPITAL] 1911; article in press.
6. Subpleural 4 mm right lung base pulmonary nodule. No follow-up
needed if patient is low-risk. Non-contrast chest CT can be
considered in 12 months if patient is high-risk. This recommendation
follows the consensus statement: Guidelines for Management of
Incidental Pulmonary Nodules Detected on CT Images: From the
7. Additional findings include centrilobular emphysema and aortic
atherosclerosis.

## 2018-07-07 IMAGING — US US ABDOMEN COMPLETE
1 series · 13 of 25 positions shown · non-contrast
Comparison: Noncontrast CT on 01/04/2016

CLINICAL DATA: Abdominal pain and decreased urine output beginning
this morning.

EXAM:
ABDOMEN ULTRASOUND COMPLETE

[Series 1: us abdomen complete · 0.25mm/px · 13 of 88 slices shown]
[im 1/88]
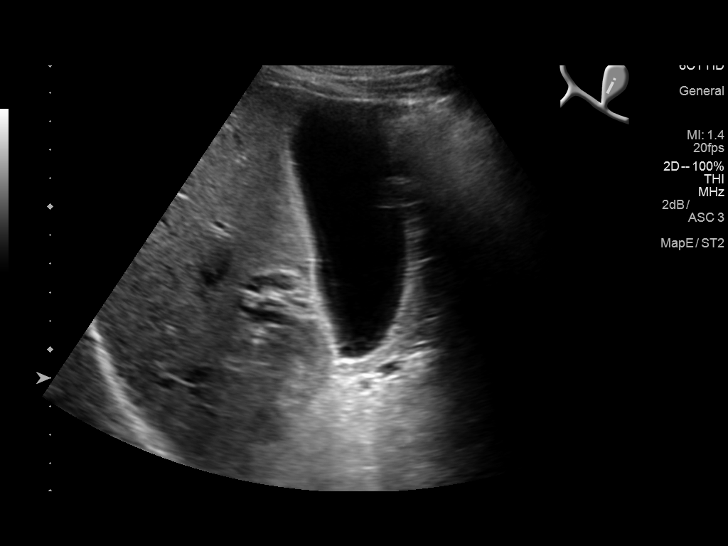
[im 8/88]
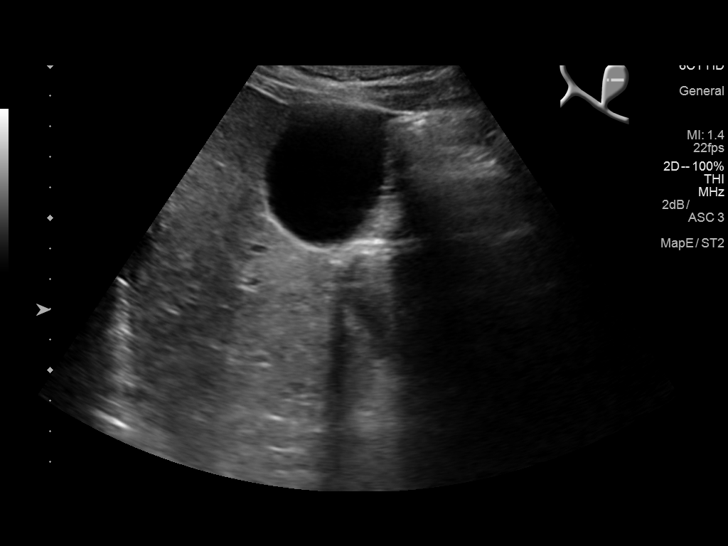
[im 15/88]
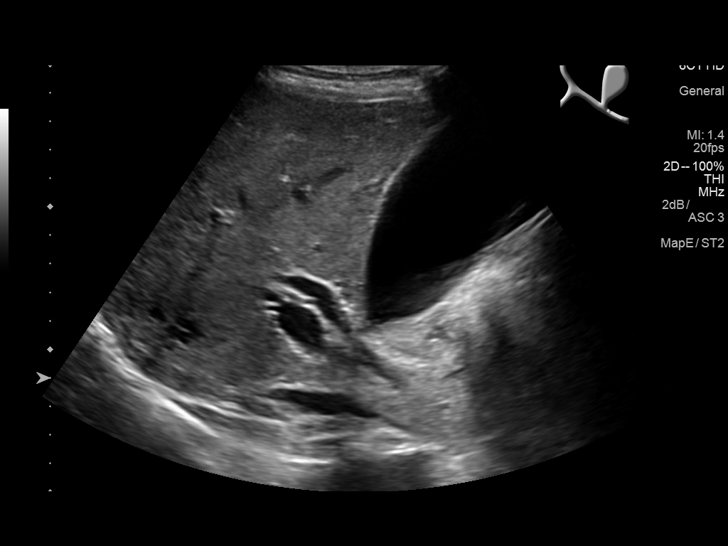
[im 22/88]
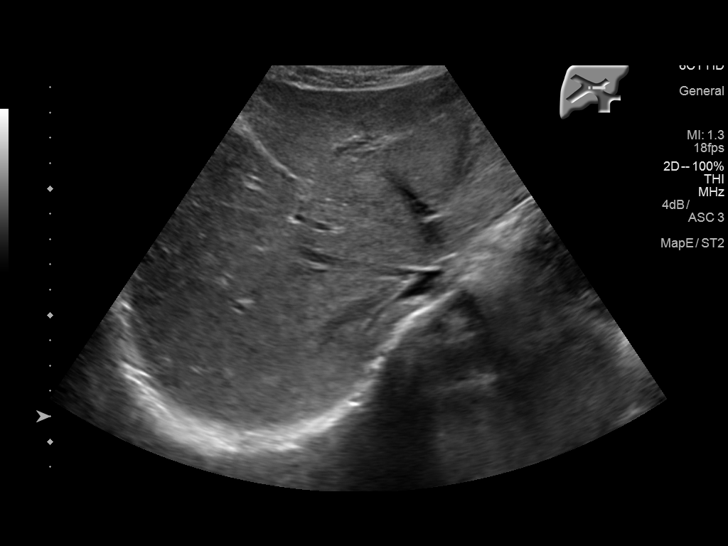
[im 30/88]
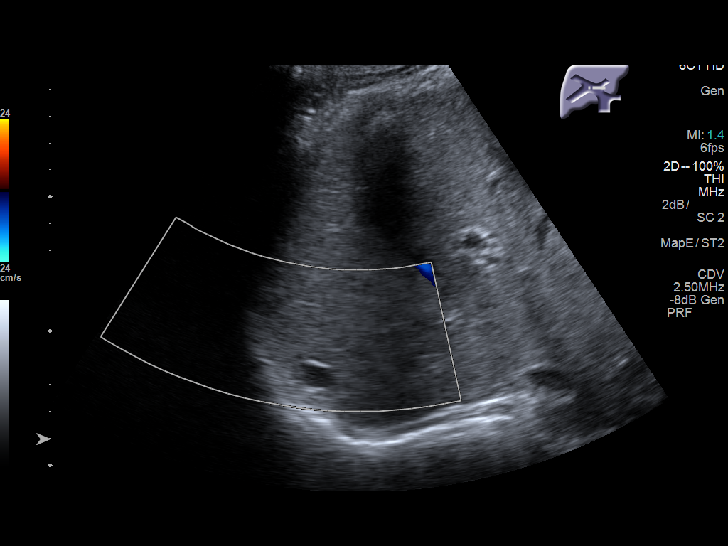
[im 37/88]
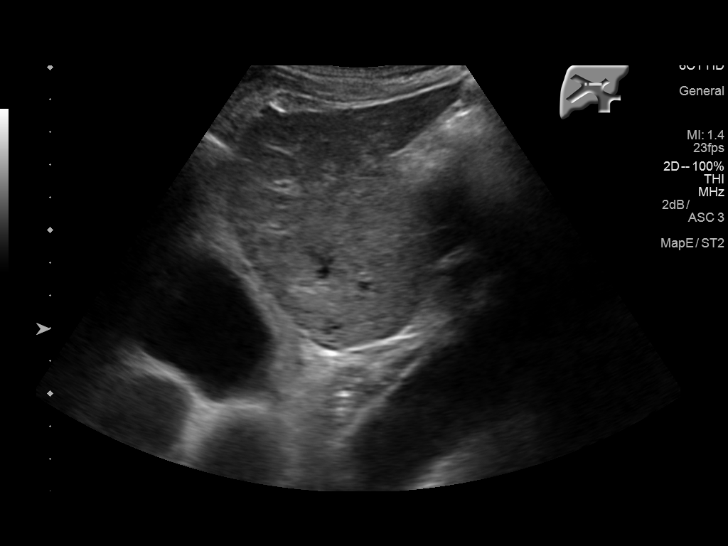
[im 44/88]
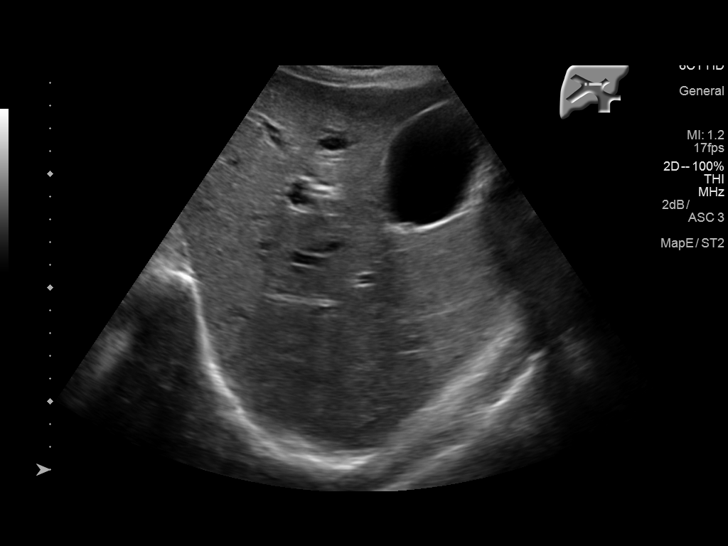
[im 51/88]
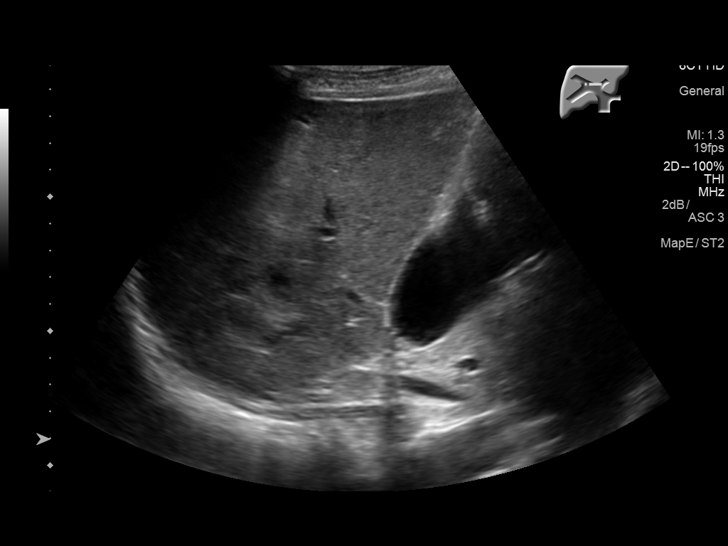
[im 59/88]
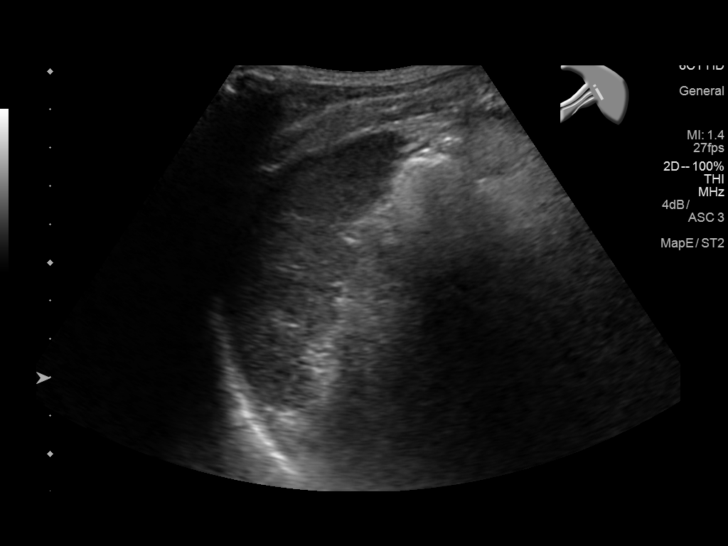
[im 66/88]
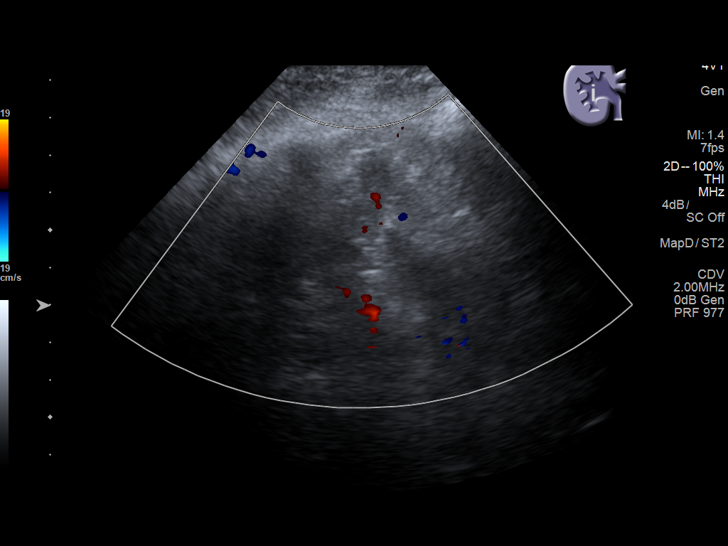
[im 73/88]
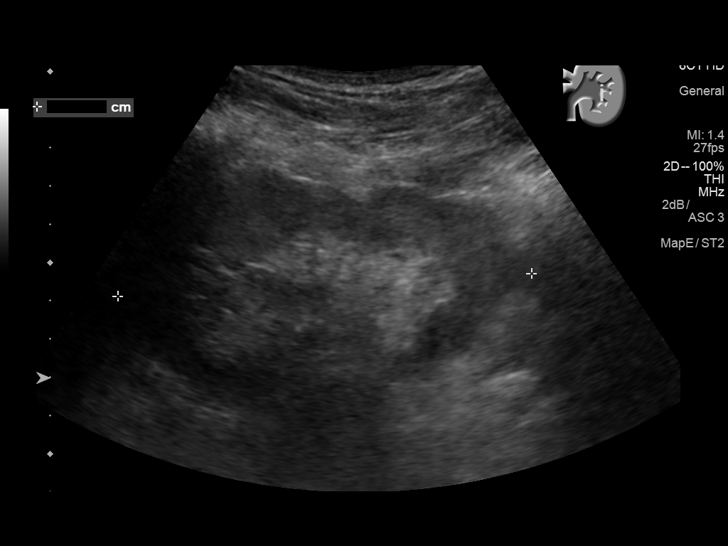
[im 80/88]
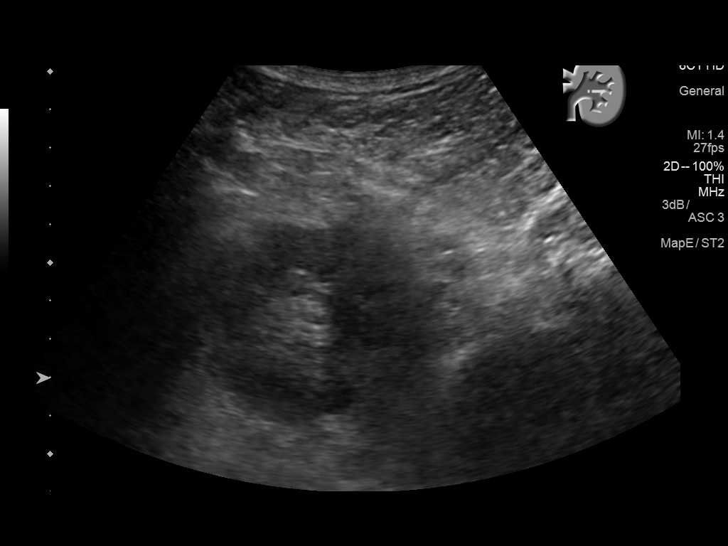
[im 88/88]
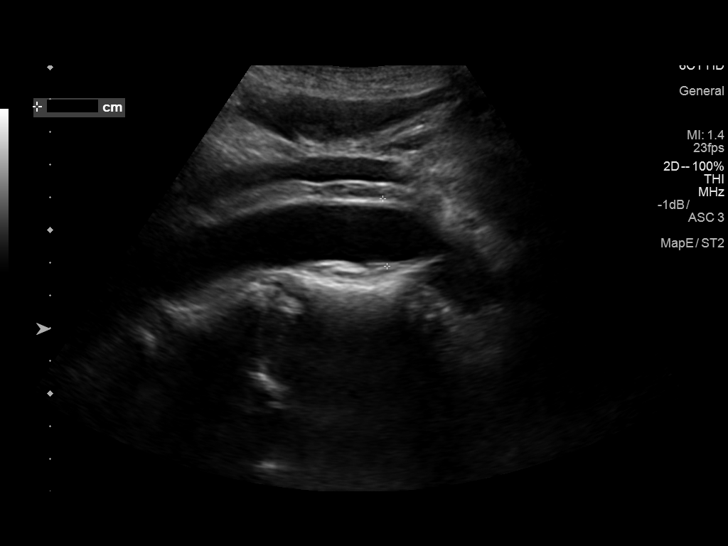

[13 of 25 positions shown; findings below may reference images not displayed]

FINDINGS: Gallbladder: No gallstones or wall thickening visualized. No
sonographic Murphy sign noted by sonographer.

Common bile duct: Diameter: 6 mm, within normal limits.

Liver: Within normal limits in parenchymal echogenicity. Several
small 1-2 cm hepatic cysts are noted. No solid liver masses
identified.

IVC: No abnormality visualized.

Pancreas: Visualized portion unremarkable.

Spleen: Size and appearance within normal limits.

Right Kidney: Length: 9.3 cm. Limited visualization. Diffuse
parenchymal atrophy. No definite mass or hydronephrosis visualized.

Left Kidney: Length: 10.8 cm. Mildly increased renal parenchymal
echogenicity. No mass or hydronephrosis visualized.

Abdominal aorta: No aneurysm visualized.

Other findings: None.
IMPRESSION: No evidence gallstones or biliary ductal dilatation.

Tiny hepatic cysts.  No liver mass visualized.

Diffuse right renal parenchymal atrophy and increased left renal
parenchymal echogenicity, consistent with medical renal disease. No
evidence of hydronephrosis.
